# Patient Record
Sex: Female | Born: 1955 | Race: White | Hispanic: No | Smoking: Never smoker
Health system: Southern US, Community
[De-identification: ages and names within clinical notes are randomized; demographics above are authoritative.]

## PROBLEM LIST (undated history)

## (undated) DIAGNOSIS — Q899 Congenital malformation, unspecified: Secondary | ICD-10-CM

## (undated) DIAGNOSIS — I10 Essential (primary) hypertension: Secondary | ICD-10-CM

## (undated) DIAGNOSIS — E785 Hyperlipidemia, unspecified: Secondary | ICD-10-CM

## (undated) HISTORY — PX: TONSILLECTOMY: SUR1361

## (undated) HISTORY — DX: Congenital malformation, unspecified: Q89.9

## (undated) HISTORY — DX: Essential (primary) hypertension: I10

## (undated) HISTORY — DX: Hyperlipidemia, unspecified: E78.5

---

## 2017-04-22 ENCOUNTER — Encounter: Admit: 2017-04-22 | Discharge: 2017-04-22 | Payer: MEDICARE

## 2017-04-22 DIAGNOSIS — Z86711 Personal history of pulmonary embolism: ICD-10-CM

## 2017-04-22 DIAGNOSIS — Z86718 Personal history of other venous thrombosis and embolism: ICD-10-CM

## 2017-04-22 DIAGNOSIS — I219 Acute myocardial infarction, unspecified: ICD-10-CM

## 2017-04-22 DIAGNOSIS — E119 Type 2 diabetes mellitus without complications: ICD-10-CM

## 2017-04-22 DIAGNOSIS — E669 Obesity, unspecified: ICD-10-CM

## 2017-04-22 DIAGNOSIS — I2699 Other pulmonary embolism without acute cor pulmonale: ICD-10-CM

## 2017-04-22 DIAGNOSIS — R011 Cardiac murmur, unspecified: Principal | ICD-10-CM

## 2017-04-22 DIAGNOSIS — K922 Gastrointestinal hemorrhage, unspecified: ICD-10-CM

## 2017-04-22 DIAGNOSIS — G894 Chronic pain syndrome: Secondary | ICD-10-CM

## 2017-04-22 DIAGNOSIS — Z780 Asymptomatic menopausal state: ICD-10-CM

## 2017-04-22 DIAGNOSIS — R200 Anesthesia of skin: Secondary | ICD-10-CM

## 2017-04-22 DIAGNOSIS — F17200 Nicotine dependence, unspecified, uncomplicated: ICD-10-CM

## 2017-04-22 DIAGNOSIS — M549 Dorsalgia, unspecified: ICD-10-CM

## 2017-04-22 DIAGNOSIS — R0600 Dyspnea, unspecified: ICD-10-CM

## 2017-04-22 DIAGNOSIS — G988 Other disorders of nervous system: ICD-10-CM

## 2017-04-22 DIAGNOSIS — I2510 Atherosclerotic heart disease of native coronary artery without angina pectoris: ICD-10-CM

## 2017-04-22 DIAGNOSIS — R609 Edema, unspecified: ICD-10-CM

## 2017-04-22 DIAGNOSIS — E785 Hyperlipidemia, unspecified: Secondary | ICD-10-CM

## 2017-04-22 DIAGNOSIS — E349 Endocrine disorder, unspecified: ICD-10-CM

## 2017-04-22 DIAGNOSIS — R0789 Other chest pain: ICD-10-CM

## 2017-04-22 LAB — CBC AND DIFF
Lab: 0 10*3/uL (ref 0–0.20)
Lab: 0.1 10*3/uL (ref 0–0.45)
Lab: 0.3 10*3/uL (ref 0–0.80)
Lab: 3.1 10*3/uL (ref 1.0–4.8)
Lab: 3.8 10*3/uL (ref 1.8–7.0)
Lab: 4.3 M/UL (ref 4.0–5.0)
Lab: 7.4 10*3/uL (ref 4.5–11.0)

## 2017-04-22 LAB — URINALYSIS DIPSTICK
Lab: 6 (ref 5.0–8.0)
Lab: NEGATIVE
Lab: NEGATIVE
Lab: NEGATIVE
Lab: NEGATIVE
Lab: NEGATIVE
Lab: NEGATIVE
Lab: NEGATIVE
Lab: NEGATIVE

## 2017-04-22 LAB — OPIATES-URINE RANDOM: Lab: NEGATIVE % (ref 0–5)

## 2017-04-22 LAB — LIVER FUNCTION PANEL
Lab: 0.2 mg/dL — ABNORMAL LOW (ref 0.3–1.2)
Lab: 13 U/L (ref 7–56)
Lab: 17 U/L (ref 7–40)
Lab: 4.6 g/dL (ref 3.5–5.0)
Lab: 66 U/L (ref 25–110)
Lab: 7.5 g/dL (ref 6.0–8.0)

## 2017-04-22 LAB — BASIC METABOLIC PANEL
Lab: 103 MMOL/L (ref 98–110)
Lab: 138 MMOL/L (ref 137–147)
Lab: 26 MMOL/L (ref 21–30)
Lab: 4.1 MMOL/L (ref 3.5–5.1)
Lab: 84 mg/dL (ref 70–100)
Lab: 9 g/dL (ref 3–12)

## 2017-04-22 LAB — PHENCYCLIDINES-URINE RANDOM: Lab: NEGATIVE % (ref 0–2)

## 2017-04-22 LAB — URINALYSIS, MICROSCOPIC

## 2017-04-22 LAB — BENZODIAZEPINES-URINE RANDOM: Lab: NEGATIVE mg/dL (ref 8.5–10.6)

## 2017-04-22 LAB — POC GLUCOSE: Lab: 84 mg/dL (ref 70–100)

## 2017-04-22 LAB — AMPHETAMINES-URINE RANDOM: Lab: NEGATIVE mg/dL (ref 7–25)

## 2017-04-22 LAB — CANNABINOIDS-URINE RANDOM: Lab: NEGATIVE mL/min (ref 24–44)

## 2017-04-22 LAB — COCAINE-URINE RANDOM: Lab: NEGATIVE mL/min (ref 4–12)

## 2017-04-22 LAB — POC TROPONIN
Lab: 0 ng/mL (ref 0.00–0.05)
Lab: 0 ng/mL (ref 0.00–0.05)

## 2017-04-22 LAB — BARBITURATES-URINE RANDOM: Lab: NEGATIVE mg/dL (ref 0.4–1.00)

## 2017-04-22 LAB — POC PT/INR: Lab: 1 (ref 0.8–1.2)

## 2017-04-22 LAB — SED RATE: Lab: 20 mm/h (ref 0–30)

## 2017-04-22 LAB — POC CREATININE, RAD: Lab: 0.8 mg/dL (ref 0.4–1.00)

## 2017-04-22 MED ORDER — MORPHINE 15 MG PO TAB
7.5 mg | ORAL | 0 refills | Status: DC | PRN
Start: 2017-04-22 — End: 2017-04-23
  Administered 2017-04-23: 02:00:00 7.5 mg via ORAL

## 2017-04-22 MED ORDER — HYDROCODONE-ACETAMINOPHEN 5-325 MG PO TAB
1-2 | ORAL | 0 refills | Status: DC | PRN
Start: 2017-04-22 — End: 2017-04-26
  Administered 2017-04-23 (×5): 1 via ORAL
  Administered 2017-04-24: 21:00:00 2 via ORAL
  Administered 2017-04-24: 14:00:00 1 via ORAL
  Administered 2017-04-25 (×4): 2 via ORAL

## 2017-04-22 MED ORDER — ENOXAPARIN 40 MG/0.4 ML SC SYRG
40 mg | Freq: Every day | SUBCUTANEOUS | 0 refills | Status: DC
Start: 2017-04-22 — End: 2017-04-23

## 2017-04-22 MED ORDER — PANTOPRAZOLE 40 MG PO TBEC
40 mg | Freq: Two times a day (BID) | ORAL | 0 refills | Status: DC
Start: 2017-04-22 — End: 2017-04-26
  Administered 2017-04-23 – 2017-04-25 (×5): 40 mg via ORAL

## 2017-04-22 MED ORDER — NICOTINE (POLACRILEX) 4 MG BU GUM
4 mg | BUCCAL | 0 refills | Status: DC | PRN
Start: 2017-04-22 — End: 2017-04-26
  Administered 2017-04-23: 04:00:00 2 mg via BUCCAL

## 2017-04-22 MED ORDER — ASPIRIN 81 MG PO CHEW
324 mg | Freq: Once | ORAL | 0 refills | Status: CP
Start: 2017-04-22 — End: ?
  Administered 2017-04-22: 22:00:00 324 mg via ORAL

## 2017-04-22 MED ORDER — ONDANSETRON HCL (PF) 4 MG/2 ML IJ SOLN
4 mg | INTRAVENOUS | 0 refills | Status: DC | PRN
Start: 2017-04-22 — End: 2017-04-26

## 2017-04-22 MED ORDER — MORPHINE 30 MG PO CM24
30 mg | Freq: Two times a day (BID) | ORAL | 0 refills | Status: DC
Start: 2017-04-22 — End: 2017-04-23

## 2017-04-22 MED ORDER — NICOTINE 14 MG/24 HR TD PT24
1 | Freq: Every day | TRANSDERMAL | 0 refills | Status: DC
Start: 2017-04-22 — End: 2017-04-26
  Administered 2017-04-23 – 2017-04-25 (×4): 1 via TRANSDERMAL

## 2017-04-22 MED ORDER — ASPIRIN 81 MG PO CHEW
81 mg | Freq: Every day | ORAL | 0 refills | Status: DC
Start: 2017-04-22 — End: 2017-04-22

## 2017-04-22 MED ORDER — MORPHINE 2 MG/ML IV CRTG
2 mg | Freq: Once | INTRAVENOUS | 0 refills | Status: CP
Start: 2017-04-22 — End: ?
  Administered 2017-04-22: 22:00:00 2 mg via INTRAVENOUS

## 2017-04-22 MED ORDER — PANTOPRAZOLE 40 MG IV SOLR
80 mg | Freq: Once | INTRAVENOUS | 0 refills | Status: CP
Start: 2017-04-22 — End: ?
  Administered 2017-04-23: 80 mg via INTRAVENOUS

## 2017-04-22 MED ORDER — ACETAMINOPHEN 325 MG PO TAB
650 mg | ORAL | 0 refills | Status: DC | PRN
Start: 2017-04-22 — End: 2017-04-26
  Administered 2017-04-23 – 2017-04-25 (×4): 650 mg via ORAL

## 2017-04-22 MED ORDER — MORPHINE 2 MG/ML IV CRTG
2 mg | Freq: Once | INTRAVENOUS | 0 refills | Status: CP
Start: 2017-04-22 — End: ?
  Administered 2017-04-22: 23:00:00 2 mg via INTRAVENOUS

## 2017-04-22 MED ORDER — ALBUTEROL SULFATE 90 MCG/ACTUATION IN HFAA
2 | Freq: Every day | RESPIRATORY_TRACT | 0 refills | Status: DC
Start: 2017-04-22 — End: 2017-04-23

## 2017-04-22 MED ORDER — ALBUTEROL SULFATE 90 MCG/ACTUATION IN HFAA
1-2 | RESPIRATORY_TRACT | 0 refills | Status: DC | PRN
Start: 2017-04-22 — End: 2017-04-23
  Administered 2017-04-23: 04:00:00 2 via RESPIRATORY_TRACT

## 2017-04-22 NOTE — ED Notes
Stroke team activated

## 2017-04-22 NOTE — ED Notes
Report given to [place], Charity fundraiser. Pt care transferred at this time.

## 2017-04-22 NOTE — Acute Stroke Response
RN Stroke Activation Summary       Date of Service:  04/22/2017  Ann Garcia is a 61 y.o.  female.     DOB: 1955-08-20                  MRN#:  [id number]  Allergies:  Amitriptyline; Amitriptyline; Amoxicillin; Ativan [lorazepam]; Divalproex; Erythromycin; Erythromycin; Penicillins; Penicillins; Relafen [nabumetone]; Sulfa (sulfonamide antibiotics); Sulfa (sulfonamide antibiotics); Sulfamethoxazole-trimethoprim; Sulfamethoxazole-trimethoprim; Tetracycline; Tetracycline; Toradol [ketorolac]; and Wellbutrin [bupropion hcl]       ASRT Arrival: 1538  Location of Response : [facility] trauma bay    Page Received: 1534          Clinical Presentation: Sensory changes, Other (comment)(R jaw pain, chest pain)    Total Stroke Scale Score: 2    Signs & Symptoms: Onset of Symptoms  Onset of Symptoms - Date: 04/22/17  Onset of Symptoms - Time: 1330     Dysphagia screen:  Did Patient Pass The Swallow Screen Part I?: Yes     Did Patient Pass The Swallow Screen Part II: Yes     Did Patient Pass The Swallow Screen: Yes         Assessment & Plan     Summary:  Pt reports she initially experienced intense chest pain with associated R jaw pain and ride face numbness at 1330 while she was exerting herself. She rested and initially felt better but then symptoms returned at about 1430 so she came to the ED. She stats her symptoms are nearly identical to previous MI for which she received no intervention. Of note, pt also reports current bloody stools that she says are from ulcers. During exam patient states her right side felt about 50% as strong as her left to touch but was able to distinguish sharp vs. Dull. She said on her lower extremity the left was actually sharper. She had difficulty raising her right leg but stated it was due to her severe back pain. CT, CTA obtained. Pt also reporting a burning sensation along her scalp with associated hair loss and vision blurriness on both sides.   Radiology/Interventional Delay  Decision to IR: No Delays: No        No current facility-administered medications for this encounter.   N/A      Plan: no acute stroke intervention. Continue ED evaluation for R numbness and chest pain. Will obtain ESR after reports of scalp tenderness.          RN handoff: Clarisse Gouge     History of Present Illness     Past Medical History:   Diagnosis Date   ??? Back pain    ??? Chest pain     jaw pain, right side of neck relieved nitro or laying down   ??? Depression (disease)    ??? DM (diabetes mellitus) (HCC)    ??? Dyslipidemia 02/20/2010   ??? Dyspnea    ??? Edema    ??? History of DVT (deep vein thrombosis) 02/20/2010   ??? History of pulmonary embolism 02/02/2010   ??? Hyperlipemia    ??? MI (myocardial infarction) (HCC)    ??? Murmur    ??? Obesity    ??? PE (pulmonary embolism)    ??? Postmenopausal    ??? Tobacco use disorder 02/20/2010   ??? Unspecified cardiovascular disease    ??? Unspecified disorders of nervous system    ??? Unspecified endocrine disorder      Past Surgical History:   Procedure Laterality Date   ??? CARDIOVASCULAR  STRESS TEST  2010    Pt states it was Incomplete due to collapsing   ??? CARDIAC CATHERIZATION  2011    clean   ??? DOPPLER ECHOCARDIOGRAPHY     ??? ELECTROCARDIOGRAM     ??? HX BACK SURGERY       Social History     Socioeconomic History   ??? Marital status: Married     Spouse name: Not on file   ??? Number of children: Not on file   ??? Years of education: Not on file   ??? Highest education level: Not on file   Social Needs   ??? Financial resource strain: Not on file   ??? Food insecurity - worry: Not on file   ??? Food insecurity - inability: Not on file   ??? Transportation needs - medical: Not on file   ??? Transportation needs - non-medical: Not on file   Occupational History   ??? Not on file   Tobacco Use   ??? Smoking status: Current Every Day Smoker     Packs/day: 0.75     Years: 15.00     Pack years: 11.25     Types: Cigars   ??? Tobacco comment: asking for help, tried patches however, burned skin,  has rx for Chantix   Substance and Sexual Activity ??? Alcohol use: Yes     Alcohol/week: 2.4 oz     Types: 4 Cans of beer per week     Comment: no really, casually   ??? Drug use: No   ??? Sexual activity: Not on file   Other Topics Concern   ??? Not on file   Social History Narrative   ??? Not on file         Mathews Argyle, RN

## 2017-04-22 NOTE — Acute Stroke Response
NAME:Ann Garcia             MRN: [id number]             DOB:July 22, 1955          AGE: 61 y.o.  ADMISSION DATE: 04/22/2017             DAYS ADMITTED: LOS: 0 days    Date of Service:  04/22/2017    Allergies:  Amitriptyline; Amitriptyline; Amoxicillin; Ativan [lorazepam]; Divalproex; Erythromycin; Erythromycin; Penicillins; Penicillins; Relafen [nabumetone]; Sulfa (sulfonamide antibiotics); Sulfa (sulfonamide antibiotics); Sulfamethoxazole-trimethoprim; Sulfamethoxazole-trimethoprim; Tetracycline; Tetracycline; Toradol [ketorolac]; and Wellbutrin [bupropion hcl]    Type of Acute Stroke Response Team note: Consult       Assessment & Plan     Chief Complaint: Chest pain/numbness    Assessment: Ann Garcia is a 61 y.o. female with h/o DVT/PE, back pain and with CAD (MI in 8/18) HLD, tobacco use disorder presented with chest/R jaw pain at 1330. She reports R side numbness starting around 1445. 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 NIHSS was 2 for R face, RUE and LLE numbness and RLE drift that is likely secondary to her chronic back pain. No IV tPA as risk outweighs benefit in this case. BP: 114/71, INR: 1.0, Trop: 0.0    Impression: Distribution of numbness is atypical of stroke although this does not rule out a small stroke    Suspected localization of Stroke Sx: Left Thalamus    Suspected etiology: Small - Vessel Occlusion    Pre-event mRS: 0 - No symptoms at all     Plan:   - No acute stroke intervention  - ER should continue work up for recent GI bleed and acute angina  - Primary team can obtain MRI of the head w/o contrast in setting of persistent numbness  - The stroke service will follow along    The patient was seen and discussed with Dr. Debroah Loop    History of Present Ilness     History of Present Illness:   Ann Garcia was at home when she began experiencing chest and R jaw pain at 1330. She reports R side numbness starting around 1445. She also reports bifrontal HA that started this morning. She denies weakness, gait instability, vertigo, diplopia or word finding difficulties. She reports receiving treatment for MI in August of 2018. The chest pain is reportedly constant and is not alleviated by repositioning. She also denies any previous stroke like symptoms.     Review of Systems     A 14 point review of systems was negative except for: Cardiovascular: positive for chest pain  Gastrointestinal: positive for diarrhea  Musculoskeletal: positive for back pain  Neurological: positive for headaches and paresthesia    Constitutional: negative   Eyes: negative   Ears, nose, mouth, throat, and face: negative   Respiratory: negative    Genitourinary: negative   Integument/breast: negative   Hematologic/lymphatic: negative   Behavioral/Psych: negative   Endocrine: negative   Allergic/Immunologic: negative    Stroke Activation Summary                               CT/CTP/CTA:        BP: 114/71 (12/17 1600)  Temp: 36 ???C (96.8 ???F) (12/17 1555)  Pulse: 75 (12/17 1600)  Respirations: 16 PER MINUTE (12/17 1600)  SpO2: 97 % (12/17 1630)  O2  Delivery: None (Room Air) (12/17 1630)  SpO2 Pulse: 79 (12/17 1630)  Height: 160 cm (63) (12/17 1527)    NIHSS Completed at: 1545      NIH Stroke Scale Item  Scoring Definition  Score    1a. LOC  0=alert and responsive   1=arousable to minor stimulation   2=arousable only to painful stimulation   3=reflex responses or unrousable  0    1b. LOC questions-as patient???s age and month. Must be exact.  0=both correct   1=one correct (or dysarthria, intubated, foreign language)   2=neither correct  0    1c. Commands-open/close eyes, grip and release non-paretic hand (other 1 step commands or mimic OK)  0=both correct (ok if impaired by weakness)   1=one correct   2=neither correct  0    2. Best Gaze-horizontal EOM by voluntary or Ann Garcia???s  0=normal   1=partial gaze palsy (abnormal gaze in one or both eyes) 2=forced eye deviation or total paresis which cannot be overcome by Ann Garcia???s  0    3. Visual Field-use visual threat if necessary. If monocular, score field of good eye  0=no visual loss   1=partial hemianopia, quadrantanopia, extinction   2=complete hemianopia   3=bilateral hemianopia or blindness  0    4. Facial Palsy-if stuporous, check symmetry of grimace to pain  0=normal   1=minor paralysis, flat NLF, asymm smile   2=partial paralysis (lower face=UMN)   3=complete paralysis (upper and lower face)  0    5. Motor Arm-arms outstretched 90 deg (sitting) or 45 deg (supine) for 10 seconds. Encourage best effort.  0=no drift x 10 seconds   1=drift but doesn???t hit bed   2=some antigravity effort, but can???t sustain   3=no antigravity effort, but even minimal mvt counts   4=no movement at all   X=unable to assess due to amputation, fusion, etc  L/R   0/0    6. Motor Leg-raise leg to 30 degrees supine x 5 seconds  0=no drift x 5 seconds   1=drift but doesn???t hit bed   2=some antigravity effort, but can???t sustain   3=no antigravity effort, but even minimal mvt counts   4=no movement at all   X=unable to assess due to amputation, fusion, etc  L/R   0/1 (likely due to back pain)    7. Limb Ataxia-check finger-nose- finger; heel-shin; and score only if out of proportion to paralysis  0=no ataxia (or aphasic, hemiplegic)   1=ataxia in upper or lower extremity   2=ataxia in upper AND lower extremity   X=unable to assess due to amputation, fusion, etc  0   8. Sensory-use safety pin. Check grimace or withdrawal if stuporous. Score only stroke- related losses  0=normal   1=mild-mod unilateral loss but patient aware of touch 9or aphasic, confused)   2=total loss, pt unaware of touch. Coma, bilateral loss  1   9. Best Language-describe cookie jar picture, name objects, read sentences. May use repeating, writing, stereognosis  0=normal   1=mild-mod aphasia (diff but partly comprehensible) 2=severe aphasia (almost no info exchanged)   3=mute, global aphasia, coma. No 1 step commands  0    10. Dysarthria-read list of words  0=normal   1=mild-mod; slurred but intelligible   2=severe; unintelligible or mute  0    11. Extinction/Neglect- simultaneously touch patient on both hands, show fingers in both visual fields, ask about deficit, left hand  0=normal, none detected. (visual loss alone)   1=neglects or extinguishes to double simult  stimulation in any modality   2=profound neglect in more than one modality  0    Score    2       Was IV tPA given? No    The patient was not a tPA candidate due to risk > benefit      Dysphagia screen:                      Performed by nursing staff and passed     Cardiac rhythm on presentation: SR         Health History     Past Medical History:   Diagnosis Date   ??? Back pain    ??? Chest pain     jaw pain, right side of neck relieved nitro or laying down   ??? Depression (disease)    ??? DM (diabetes mellitus) (HCC)    ??? Dyslipidemia 02/20/2010   ??? Dyspnea    ??? Edema    ??? History of DVT (deep vein thrombosis) 02/20/2010   ??? History of pulmonary embolism 02/02/2010   ??? Hyperlipemia    ??? MI (myocardial infarction) (HCC)    ??? Murmur    ??? Obesity    ??? PE (pulmonary embolism)    ??? Postmenopausal    ??? Tobacco use disorder 02/20/2010   ??? Unspecified cardiovascular disease    ??? Unspecified disorders of nervous system    ??? Unspecified endocrine disorder      Past Surgical History:   Procedure Laterality Date   ??? CARDIOVASCULAR STRESS TEST  2010    Pt states it was Incomplete due to collapsing   ??? CARDIAC CATHERIZATION  2011    clean   ??? DOPPLER ECHOCARDIOGRAPHY     ??? ELECTROCARDIOGRAM     ??? HX BACK SURGERY       Family History   Problem Relation Age of Onset   ??? Hypertension Mother    ??? Coronary Artery Disease Father    ??? Sudden Cardiac Death Father    ??? Hypertension Father    ??? Coronary Artery Disease Sister    ??? Premature Heart Disease Sister    ??? Hypertension Sister Social History     Socioeconomic History   ??? Marital status: Married     Spouse name: Not on file   ??? Number of children: Not on file   ??? Years of education: Not on file   ??? Highest education level: Not on file   Social Needs   ??? Financial resource strain: Not on file   ??? Food insecurity - worry: Not on file   ??? Food insecurity - inability: Not on file   ??? Transportation needs - medical: Not on file   ??? Transportation needs - non-medical: Not on file   Occupational History   ??? Not on file   Tobacco Use   ??? Smoking status: Current Every Day Smoker     Packs/day: 0.75     Years: 15.00     Pack years: 11.25     Types: Cigars   ??? Tobacco comment: asking for help, tried patches however, burned skin,  has rx for Chantix   Substance and Sexual Activity   ??? Alcohol use: Yes     Alcohol/week: 2.4 oz     Types: 4 Cans of beer per week     Comment: no really, casually   ??? Drug use: No   ??? Sexual activity: Not on file   Other  Topics Concern   ??? Not on file   Social History Narrative   ??? Not on file        Medications:      PRN Medications:         Physical Exam     HEENT: normocephalic, eyes open with no discharge, nares patent, oropharynx is clear with no lesions, palate intact  CV: regular rate, distal pulses palpable  Chest: normal configuration, equal chest rise bilaterally  Ab: soft, non-tender, no masses, no organomegaly  Skin: no rashes or lesions    Extended Neuro Exam:    Mental status: alert, oriented to person/place/time  Speech:    Normal Abnormal   Fluency x    Comprehension x    Articulation x    Repetition x    Naming x        Cranial Nerves:    Normal Abnormal   II x    III, IV, VI x    V x    VII                         x                             VIII x    IX, X x    XI x    XII x        Muscle/motor:   Tone: nml  Bulk: nml  Fasciculations: none  Pronator drift: none     NF NE SA EF EE WE WF FF FE FA TA HF HA HE KF KE DF PF   R   5 5 5   5 5   4     5 5    L   5 5 5   5 5   5     5 5        Sensation: Normal RUE LUE RLE LLE   Light Touch  x  x    Pin Prick  x  x    Temperature        Vibration        Proprioception            Coordination:    Normal Abnormal Right Abnormal Left   Finger to Nose x     Rapid alternating  x     Heel to Shin x     Finger tap      Foot tap        Gait and Sation:  deferred    Reflexes:    Right Left   Triceps     Biceps 2 2   Brachioradialis 2 2   Patella 2 2   Ankle 2 2   Plantar down down          Lab/Radiology/Other Diagnostic Tests:  Hematology:    Lab Results   Component Value Date    HGB 13.3 04/22/2017    HCT 39.3 04/22/2017    PLTCT 279 04/22/2017    WBC 7.4 04/22/2017    NEUT 51 04/22/2017    ANC 3.80 04/22/2017    ALC 3.10 04/22/2017    MONA 4 04/22/2017    AMC 0.30 04/22/2017    ABC 0.00 04/22/2017    MCV 91.5 04/22/2017    MCHC 33.7 04/22/2017    MPV 7.7 04/22/2017    RDW 14.8 04/22/2017   , Coagulation:  Lab Results   Component Value Date    PTT 37.1 06/06/2011    INR 1.0 04/22/2017    INR 1.2 06/06/2011    and General Chemistry:    Lab Results   Component Value Date    NA 138 04/22/2017    K 4.1 04/22/2017    CL 103 04/22/2017    GAP 9 04/22/2017    BUN 22 04/22/2017    CR 0.8 04/22/2017    CR 0.70 04/22/2017    GLU 84 04/22/2017    GLU 87 09/01/2005    CA 9.7 04/22/2017    ALBUMIN 4.6 04/22/2017    LACTIC 0.9 04/25/2007    MG 1.8 06/06/2011    TOTBILI 0.2 04/22/2017     Glucose: 84 (04/22/17 1535)  Hemoccult: (S) Positive (04/22/17 1613)  Pertinent radiology reviewed.    CT head:  1. No evidence of acute intracranial hemorrhage or mass effect.  2. 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  3. Mild supratentorial white matter hypodensities, likely secondary to chronic microvascular ischemic disease.    ___________________________________________  Wess Botts, APRN-NP  Vascular Neurology  [phone]

## 2017-04-22 NOTE — ED Notes
Pt reports headache and ear buzzing. Will notify Dr Pricilla Riffle.

## 2017-04-22 NOTE — ED Notes
All belongings gathered and placed in belonging bag with patient labels at bedside.  The bag(s) contain(s) the following:    Clothing: shirt, bra, pants, shoes  Other: purse; back brace    All belongings placed in 1 bag(s).    Belongings disposition: all with patient at bedside.

## 2017-04-22 NOTE — Progress Notes
Stroke Activation:     10 F with a h/o CAD, DM, HTN, ongoing GI bleeding, presents with chest pain, right jaw pain and right sided numbness. LKW - 2:30 PM on 04/22/17. Right sided face, arm and leg numbness started after right jaw pain. She has chronic back pain. She reports some headache started after chest pain.      BP 114/71. NIHSS 2 (right sided numbness, old R leg drift). CT head no acute changes.     No tPA due to current GI bleed and low NIHSS. No suspicion for LVO. Stroke mimic vs small stroke. Further work up for chest pain and GI bleeding per ED. Get an MRI. Will follow up on MRI.           Otelia Limes MD, MPH  Vascular Neurology Fellow   Pager: [phone]  Stroke Pager: [phone]

## 2017-04-22 NOTE — ED Notes
Pt wheeled to bathroom with ED tech. ABCs intact and NAD noted. Pt provided urine cup and wipes. Pt educated about specimen collection, and verbalized understanding.

## 2017-04-22 NOTE — ED Notes
61 yr old F presents to ED via EMS with CC of R jaw pain ~1330. Reports initial concern for heart attack stating "it feels like the last one I had". Reports numbness to R side ~1445. Hx of low back pain. Reports unable to lift RLE due to back pain. Pt is aaox4, skin p/w/d, and eupneic on RA. Bed in low, locked position with side rails up x 2. Pt placed on monitor. Stroke and treatment team at bedside.

## 2017-04-22 NOTE — ED Notes
Pt also reporting hx of bleeding ulcers with dark blood in stools last night. Denies use of blood thinners.

## 2017-04-23 DIAGNOSIS — R079 Chest pain, unspecified: Principal | ICD-10-CM

## 2017-04-23 LAB — RVP VIRAL PANEL PCR: Lab: DETECTED

## 2017-04-23 LAB — CBC AND DIFF: Lab: 6.2 K/UL — ABNORMAL HIGH (ref >60)

## 2017-04-23 LAB — BASIC METABOLIC PANEL
Lab: 107 MMOL/L (ref >60)
Lab: 138 MMOL/L (ref 137–147)
Lab: 3.8 MMOL/L — ABNORMAL LOW (ref 3.5–5.1)

## 2017-04-23 LAB — MAGNESIUM: Lab: 2.1 mg/dL — ABNORMAL HIGH (ref >60)

## 2017-04-23 MED ORDER — PEG-ELECTROLYTE SOLN 420 GRAM PO SOLR
2 L | ORAL | 0 refills | Status: DC | PRN
Start: 2017-04-23 — End: 2017-04-26

## 2017-04-23 MED ORDER — PEG-ELECTROLYTE SOLN 420 GRAM PO SOLR
4 L | ORAL | 0 refills | Status: DC
Start: 2017-04-23 — End: 2017-04-26

## 2017-04-23 MED ORDER — OSELTAMIVIR 75 MG PO CAP
75 mg | Freq: Two times a day (BID) | ORAL | 0 refills | Status: DC
Start: 2017-04-23 — End: 2017-04-26
  Administered 2017-04-24 – 2017-04-25 (×4): 75 mg via ORAL

## 2017-04-23 MED ORDER — BISACODYL 5 MG PO TBEC
10 mg | Freq: Once | ORAL | 0 refills | Status: AC | PRN
Start: 2017-04-23 — End: ?

## 2017-04-23 MED ORDER — AMINOPHYLLINE 500 MG/20 ML IV SOLN
50 mg | INTRAVENOUS | 0 refills | Status: AC | PRN
Start: 2017-04-23 — End: ?

## 2017-04-23 MED ORDER — FLU VACC QS2018-19 6MOS UP(PF) 60 MCG (15 MCG X 4)/0.5 ML IM SYRG
.5 mL | Freq: Once | INTRAMUSCULAR | 0 refills | Status: CP
Start: 2017-04-23 — End: ?
  Administered 2017-04-25: 16:00:00 0.5 mL via INTRAMUSCULAR

## 2017-04-23 MED ORDER — ALBUTEROL SULFATE 90 MCG/ACTUATION IN HFAA
2 | Freq: Every day | RESPIRATORY_TRACT | 0 refills | Status: DC
Start: 2017-04-23 — End: 2017-04-26
  Administered 2017-04-24: 12:00:00 2 via RESPIRATORY_TRACT

## 2017-04-23 MED ORDER — FLU VACC QS2018-19 6MOS UP(PF) 60 MCG (15 MCG X 4)/0.5 ML IM SYRG
.5 mL | Freq: Once | INTRAMUSCULAR | 0 refills | Status: DC
Start: 2017-04-23 — End: 2017-04-23

## 2017-04-23 MED ORDER — NITROGLYCERIN 0.4 MG SL SUBL
.4 mg | SUBLINGUAL | 0 refills | Status: AC | PRN
Start: 2017-04-23 — End: ?

## 2017-04-23 MED ORDER — SODIUM CHLORIDE 0.9 % IV SOLP
INTRAVENOUS | 0 refills | Status: CN
Start: 2017-04-23 — End: ?

## 2017-04-23 MED ORDER — ALBUTEROL SULFATE 90 MCG/ACTUATION IN HFAA
2 | RESPIRATORY_TRACT | 0 refills | Status: DC | PRN
Start: 2017-04-23 — End: 2017-04-26

## 2017-04-23 MED ORDER — SODIUM CHLORIDE 0.9 % IV SOLP
250 mL | INTRAVENOUS | 0 refills | Status: AC | PRN
Start: 2017-04-23 — End: ?

## 2017-04-23 MED ORDER — GADOBENATE DIMEGLUMINE 529 MG/ML (0.1MMOL/0.2ML) IV SOLN
15 mL | Freq: Once | INTRAVENOUS | 0 refills | Status: CP
Start: 2017-04-23 — End: ?
  Administered 2017-04-23: 08:00:00 15 mL via INTRAVENOUS

## 2017-04-23 MED ORDER — REGADENOSON 0.4 MG/5 ML IV SYRG
.4 mg | Freq: Once | INTRAVENOUS | 0 refills | Status: CP
Start: 2017-04-23 — End: ?
  Administered 2017-04-23: 14:00:00 0.4 mg via INTRAVENOUS

## 2017-04-23 NOTE — Case Management (ED)
Case Management Admission Assessment    NAME:Ann Garcia                          MRN: [id number]             DOB:22-Aug-1955          AGE: 61 y.o.  ADMISSION DATE: 04/22/2017             DAYS ADMITTED: LOS: 1 day      Today???s Date: 04/23/2017    Source of Information: patient     Plan  Plan: Case Management Assessment, Discharge Planning for Facility Anticipated(back to shelter)     Pt came from [facility] in [place] [place]  Pt will go back at d/c  No family support here  NCM reviewed EMR, labs, MAR and Notes.  No ongoing therapy noted.  No DME requested.  No other CM needs identified at this time  NCm will continue to assess and monitor for changes          Patient Address/Phone  [address]  [place] [address]  [phone] (home)     Emergency Contact  Extended Emergency Contact Information  Primary Emergency Contact: Edd [place]  Home Phone: [phone]  Mobile Phone: [phone]  Relation: Daughter  Interpreter needed? No  Secondary Emergency Contact: Laury Deep  Mobile Phone: [phone]  Relation: Son  Preferred language: ENGLISH  Interpreter needed? No    Forensic scientist: No, patient does not have a healthcare directive  Would patient like to fill out a (a new) Editor, commissioning?: N/A  Psych Advance Directive (Psych unit only): No, patient does not have a Social research officer, government  Does the patient need discharge transport arranged?: Yes  Transportation Name, Phone and Availability #1: cab voucher    Expected Discharge Date       Living Situation Prior to Admission  ? Living Arrangements  Type of Residence: Homeless  Shelter Name: [facility] [place]  Living Arrangements: Alone  ? Level of Function   Prior level of function: Independent  ? Cognitive Abilities   Cognitive Abilities: Alert and Oriented, Engages in problem solving and planning    Financial Resources  ? Coverage  Primary Insurance: Medicare Secondary Insurance: No insurance  Additional Coverage: RX    ? Source of Income   Source Of Income: SSDI  ? Financial Assistance Needed?  no    Psychosocial Needs  ? Mental Health     ? Substance Use History     ? Other  no    Current/Previous Services  ? PCP  Fawn Kirk, [phone], [phone]  ? Pharmacy    [facility] [id number] - [address] [place], [place] - [address]  [address] [facility]  [place] [address]  Phone: [phone] Fax: [phone]    ? Durable Medical Equipment   Durable Medical Equipment at home: None  ? Home Health  Receiving home health: No  ? Hemodialysis or Peritoneal Dialysis  Undergoing hemodialysis or peritoneal dialysis: No  ? Tube/Enteral Feeds  Receive tube/enteral feeds: No  ? Infusion  Receive infusions: No  ? Private Duty  Private duty help used: No  ? Home and Community Based Services  Home and community based services: No  ? Ryan White  Ryan White: No  ? Hospice  Hospice: No  ? Outpatient  Therapy  PT: No  OT: No  SLP: No  ? Skilled Nursing Facility/Nursing Home  SNF: No  NH: No  ? Inpatient Rehab  IPR: No  ? Long-Term Acute Care Hospital  LTACH: No  ? Acute Hospital Stay  Acute Hospital Stay: In the past  Was patient's stay within the last 30 days?: No  When did patient receive care?: 12-2016  Name of hospital: [facility] [place], Jaci Lazier RN-NCM  Case Management Department  [phone]  [phone]

## 2017-04-23 NOTE — Progress Notes
RT Adult Assessment Note    NAME:Jacyln JERMIAH BASICH             MRN: [id number]             DOB:1955/08/30          AGE: 61 y.o.  ADMISSION DATE: 04/22/2017             DAYS ADMITTED: LOS: 0 days    RT Treatment Plan:  Protocol Plan: Medications  Albuterol: MDI Q day(Home reg)    Protocol Plan: Procedures  Comment: No OSA Hx    Additional Comments:  Impressions of the patient: No respiratory distress.  Intervention(s)/outcome(s): Cont on home reg Alb QDay.  Patient education that was completed: none  Recommendations to the care team: none    Vital Signs:  Pulse:    RR: Respirations: 16 PER MINUTE  SpO2: SpO2: 95 %  O2 Device:  room air  Liter Flow:    O2%: O2 Percent: 21 %  Breath Sounds:    Respiratory Effort: Respiratory Effort: Non-Labored

## 2017-04-23 NOTE — ED Notes
[id number]. Cleaning. Please call Tim @ [phone] for report

## 2017-04-23 NOTE — Consults
Gastroenterology Consult Note  Patient Name:Ann Garcia         ZOX:[id number]  Admission Date: 04/22/2017  3:53 PM      Principal Problem:    Chest pain  Active Problems:    Tobacco use disorder    Chronic pain syndrome    Melena      History of Present Illness/Subjective:  Ann Garcia is a 61 y.o. female with history of chronic pain on opioid narcotics was admitted 12/17 for chest pain, right-sided numbness reported GI bleeding.    She was admitted with chest pain and right-sided jaw pain with right-sided numbness.  She was stroke activated and recommendation was no acute stroke intervention, low suspicion.   Per documentation, she was apparently perseverating on need for pain medications during her admission interview with pain in her head, neck, back legs and feet.  She reported being on long-acting morphine but there is been no fill records in [facility] for several years.    On speaking with her, the rectal bleeding has been ongoing since August 2018 and her last endoscopy was 2006 at an outside hospital.  She describes having both episodes of dark stool and small volume hematochezia.  She actually also endorses hemoptysis on some occasions, again small volume.  She seems to have both symptoms of constipation and diarrhea has not tried any specific therapy for these.  She also have symptoms of dyspepsia and dysphagia and does take Prilosec.  Last episode with hematochezia was Monday and she most recently saw dark, liquid stool on Wednesday.    Assessment/ Plan:    Concern for GI bleeding  ??? Patient reports melena and hematochezia.  Also intermittent hematemesis.  Essentially gives [place] a pan positive GI review of systems  ??? Last endoscopy reportedly 2006.    ??? Hemoglobin 12.1, no anticoagulation.    Chest pain, normal stress test 12/18   Positive for Flu A (H1 N1)  Tobacco abuse  Chronic pain reportedly on opioid narcotic therapy    Recommendations: -Proceed with EGD and colonoscopy tomorrow (hematemesis, melena and hematochezia)  -NPO at 0000, hold anticoagulation    Patient discussed with Dr. Marina Goodell, MD  GI/Hepatology Fellow [phone]    -----------------------------  PMH:  Past Medical History:   Diagnosis Date   ??? Back pain    ??? Chest pain     jaw pain, right side of neck relieved nitro or laying down   ??? Depression (disease)    ??? DM (diabetes mellitus) (HCC)    ??? Dyslipidemia 02/20/2010   ??? Dyspnea    ??? Edema    ??? History of DVT (deep vein thrombosis) 02/20/2010   ??? History of pulmonary embolism 02/02/2010   ??? Hyperlipemia    ??? MI (myocardial infarction) (HCC)    ??? Murmur    ??? Obesity    ??? PE (pulmonary embolism)    ??? Postmenopausal    ??? Tobacco use disorder 02/20/2010   ??? Unspecified cardiovascular disease    ??? Unspecified disorders of nervous system    ??? Unspecified endocrine disorder        Current medications:  No current facility-administered medications on file prior to encounter.      Current Outpatient Medications on File Prior to Encounter   Medication Sig Dispense Refill   ??? albuterol (PROVENTIL; VENTOLIN) 90 mcg/actuation inhaler Inhale 2 Puffs by mouth every 6 hours as needed.       ???  morphine ER (AVINZA) 30 mg PO capsule Take 30 mg by mouth every 12 hours      ??? nitroglycerin (NITROSTAT) 0.4 mg tablet Place 0.4 mg under tongue every 5 minutes as needed.         PSH:  Past Surgical History:   Procedure Laterality Date   ??? CARDIOVASCULAR STRESS TEST  2010    Pt states it was Incomplete due to collapsing   ??? CARDIAC CATHERIZATION  2011    clean   ??? DOPPLER ECHOCARDIOGRAPHY     ??? ELECTROCARDIOGRAM     ??? HX BACK SURGERY         SH:  Social History     Socioeconomic History   ??? Marital status: Married     Spouse name: Not on file   ??? Number of children: Not on file   ??? Years of education: Not on file   ??? Highest education level: Not on file   Social Needs   ??? Financial resource strain: Not on file ??? Food insecurity - worry: Not on file   ??? Food insecurity - inability: Not on file   ??? Transportation needs - medical: Not on file   ??? Transportation needs - non-medical: Not on file   Occupational History   ??? Not on file   Tobacco Use   ??? Smoking status: Current Every Day Smoker     Packs/day: 0.75     Years: 15.00     Pack years: 11.25     Types: Cigars   ??? Tobacco comment: asking for help, tried patches however, burned skin,  has rx for Chantix   Substance and Sexual Activity   ??? Alcohol use: Yes     Alcohol/week: 2.4 oz     Types: 4 Cans of beer per week     Comment: no really, casually   ??? Drug use: No   ??? Sexual activity: Not on file   Other Topics Concern   ??? Not on file   Social History Narrative   ??? Not on file       FH:  Family History   Problem Relation Age of Onset   ??? Hypertension Mother    ??? Coronary Artery Disease Father    ??? Sudden Cardiac Death Father    ??? Hypertension Father    ??? Coronary Artery Disease Sister    ??? Premature Heart Disease Sister    ??? Hypertension Sister        Review of Systems:  Constitutional: No fevers, chills, ++weight loss  Eyes: No vision deficit, no icterus  Ears, nose, mouth: No oral bleeding, ulcer  Cardiovascular: No chest pain, palpitations  Respiratory: ++dyspnea, cough   Gastrointestinal: ++abdominal pain, blood in stool  Musculoskeltal: No joint inflammation, new deformity  Integumentary: No rashes, exudate  Neurologic: No cognitive change, focal weakness  Hematologic: No for easy bleeding or bruising  Please see HPI for additional pertinent documentation    Physical Exam:  Vitals:    04/22/17 2200 04/22/17 2305 04/23/17 0230 04/23/17 0552   BP:  108/55 114/59    Pulse:  78 68    Temp:  36.6 ???C (97.9 ???F) 36.5 ???C (97.7 ???F)    SpO2: 95% 100% 99% 94%   Weight:       Height:         Constitutional- Vitals above, no acute distress.   Head - Normocephalic, atraumatic.   Eyes -  EOMI grossly. No icterus or injection. Ears, nose,  mouth, throat- No oral ulcer or bleeding.   Neck - No swelling or tracheal deviation.   Respiratory - Symmetric chest rise, no increased work of breathing.  Cardiovascular - Peripheral pulses intact, no pedal edema.  Gastrointestinal- Non-TTP. No hepatospenomegaly.   Skin - No exposed rash, lesion.  Neurologic - No CN deficit, normal fluid speech  Psychiatric - Tangential.     Labs/Imaging:  Pertient labs/imaging were reviewed on initiation of progress note.

## 2017-04-23 NOTE — Care Coordination-Inpatient
Please page [phone] with any questions until 8am after that please page Med private D

## 2017-04-23 NOTE — Progress Notes
Neurology Stroke Service: Interval Note    Ann Garcia     Principal Problem:    Chest pain  Active Problems:    Tobacco use disorder    Chronic pain syndrome    Melena    Updated Assessment:  Ann Garcia was stroke activated on 12/17 for right side numbness in the setting of R jaw and chest pain. The distribution of numbness on 12/17 was atypical for stroke but we were not able to rule it out. We requested a MRI of the head w/o contrast which was obtained overnight. These images found no evidence of acute stroke. Ann Garcia was assessed today by the Stroke Service and her right side numbness persists. A thorough sensory exam was completed which included a vibration. Again, the exam was atypical for stroke.     Impression:   Numbness is unlikely to be from stroke etiology. More favored is an acute response to stress/anxiety as she has verbalized significant stress as of late. She reports a HA at this time and complicated migraines can play a role to sensory deficits.    Plan:    Secondary Stroke Risk optimization:  - Start 81mg  ASA daily  - BP goals: <130/80  - LDL goal is <70  - Hgb A1c goal is <7.0  - Maintain smoking abstinence  - Follow up with PCP for longitudinal stroke risk factor management  - Thank you for allowing [place] to participate in Ann Garcia care, the stroke service will sign off at this time. Please page [phone] for questions or concerns.    Patient seen and discussed with Dr. Debroah Loop    ________________________________  Wess Botts, APRN-NP  Vascular Neurology  [phone]

## 2017-04-23 NOTE — ED Notes
Pt report given to Pat Kocher RN for [employer] [id number]

## 2017-04-23 NOTE — ED Notes
Report given to Tim, RN.

## 2017-04-24 ENCOUNTER — Encounter: Admit: 2017-04-24 | Discharge: 2017-04-24 | Payer: MEDICARE

## 2017-04-24 DIAGNOSIS — K922 Gastrointestinal hemorrhage, unspecified: Principal | ICD-10-CM

## 2017-04-24 LAB — CBC AND DIFF
Lab: 39 % — ABNORMAL HIGH (ref 36–45)
Lab: 4.4 M/UL — ABNORMAL LOW (ref >60)
Lab: 6.5 K/UL — ABNORMAL HIGH (ref >60)

## 2017-04-24 LAB — TROPONIN-I: Lab: 0 ng/mL (ref 0.0–0.05)

## 2017-04-24 LAB — MAGNESIUM: Lab: 2.1 mg/dL (ref >60)

## 2017-04-24 LAB — BASIC METABOLIC PANEL: Lab: 137 MMOL/L — ABNORMAL HIGH (ref >60)

## 2017-04-24 MED ORDER — SODIUM CHLORIDE 0.9 % IV SOLP
INTRAVENOUS | 0 refills | Status: DC
Start: 2017-04-24 — End: 2017-04-26
  Administered 2017-04-24 – 2017-04-25 (×3): 1000.000 mL via INTRAVENOUS

## 2017-04-24 MED ORDER — SODIUM CHLORIDE 0.9 % IV SOLP
INTRAVENOUS | 0 refills | Status: CN
Start: 2017-04-24 — End: ?

## 2017-04-24 NOTE — Progress Notes
General Progress Note    Name:  Ann Garcia   ZOXWR'U Date:  04/23/2017  Admission Date: 04/22/2017  LOS: 1 day                     Assessment/Plan:    Principal Problem:    Chest pain  Active Problems:    Tobacco use disorder    Chronic pain syndrome    Melena    61 year-old with a PMH significant for tobacco abuse and chronic pain on chronic opioids who was admitted 04/22/2017 for chest pain, R sided numbness, and intermittent melena and hematochezia.     Influenza:  - RVP positive on 12/18 with H1N1.   - Start Tamiflu. Droplet isolation.    R sided numbness and jaw pain: resolved.   - developed suddenly while at a stressful meeting today, so EMS was called  - was stroke activated in the ED  - CT head showed no evidence of acute intracranial hemorrhage or mass effect, but there was tiny chronic appearing right superior cerebellar infarct which was new since 2013; there was mild supratentorial white matter hypodensities, likely secondary to   chronic microvascular ischemic disease  - per neuro/stroke team, no acute stroke intervention was recommended with low suspicion for acute CVA at this time   - MRI of brain with previous CVA:   1. No acute/recent infarct or intracranial mass.  2. 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  3. Mild small FLAIR hyperintensities within the hemispheric white matter.   These are nonspecific, however are commonly secondary to chronic small   vessel ischemic changes in patients this age.    Chest pain  - began this morning after she got scared that a shelf was going to fall on her; reported associated nausea and dyspnea  - symptoms improved after she sat down to rest and smoke a cigar but returned during a stressful meeting later in the day and continues to be intermittent since then  - previous cardiac cath done 02/2010 here was normal  - EKG on admission with no acute ST-T wave changes  - troponin negative - Stress test 12/18: This study is probably normal. There is a small area of mild intensity fixed apical decrease tracer uptake noted in the upright rest and post stress images.  No significant reversible perfusion abnormalities seen.  No significant perfusion abnormalities seen in the supine post stress images.  Overall, study is suggestive of attenuation artifact.  Left ventricular systolic function is normal. There are no high risk prognostic indicators present.  The ECG portion of the study is nondiagnostic for ischemia due to baseline artifacts. Frequent premature ventricular complex beats.    Intermittent melena and hematochezia: symptoms suggestive of hemorrhoids.   - patient reports this has been ongoing since August  - has not had endoscopic evaluation done since 2006 and can't remember what her results were  - hgb stable and WNL on admission  - per ED, was hemoccult positive  - Gi consulted: plan for EGD/ Colon in AM.   - continue PPI PO BID    Acute on chronic pain on chronic opioid therapy  - pt perseverating on need for pain medications during admission interview  - reporting pain in her head, neck, back, legs and feet that is rated 11.5/10  - reports being on morphine ER 30 mg PO q12h at home, however per pharmacy, there has been no fill of this in [place] or [place] since 2016; did  have some Vicodin filled back in August   - no indication for IV narctoics as requested by the patient  - will not plan to continue morphine ER  - start Vicodin q4h PRN: while inpatient.     Tobacco abuse  - smokes 2-3 cigars per day  - will order nicotine patch and nicotine gum while inpatient per patient request     FEN  - No IV fluids   - lytes stable on admission   - regular diet -- EGD and Colonoscopy in AM, NPO at MN.     DVT prophylaxis  - SCDs  - no pharmacologic prophylaxis with concern for GI bleeding     Disposition  - Full Code -- confirmed with patient on admission   - Continue inpatient care. Complexity of decision making high. 35 minutes were spent with >50% time spent face to face or on floor.   ________________________________________________________________________    Subjective  Ann Garcia is a 61 y.o. female.  Patient was admitted with CP/ reported blood in stool. Overnight no events. Later in day, patient was reporting chills. No HA/Dizziness. No recurrent chest pain. Patient developed fever later in day and RVP was positive for Influenza.     Medications  Scheduled Meds:  albuterol (PROAIR HFA, VENTOLIN HFA, or PROVENTIL HFA) inhaler 2 puff 2 puff Inhalation QDAY   electrolyte GUT PEG (NULYTELY, COLYTE, GAVILYTE-N) oral solution 4 L 4 L Oral As Prescribed   [START ON 04/24/2017] influenza (=>6 MO)(QUADrivalent) (FLULAVAL) 60 mcg (15 mcg x 4)/0.5 mL 2018-19 PF syringe 0.5 mL 0.5 mL Intramuscular ONCE   nicotine (NICODERM CQ STEP 2) 14 mg/day patch 1 patch 1 patch Transdermal QDAY   pantoprazole DR (PROTONIX) tablet 40 mg 40 mg Oral BID(11-21)   Continuous Infusions:  PRN and Respiratory Meds:acetaminophen Q6H PRN, albuterol PRN, bisacodyl Once PRN, electrolyte GUT PEG PRN (On Call from Rx), HYDROcodone/acetaminophen Q4H PRN, nicotine polacrilex Q2H PRN, ondansetron (ZOFRAN) IV Q6H PRN      Objective:                          Vital Signs: Last Filed                 Vital Signs: 24 Hour Range   BP: 110/64 (12/18 2235)  Temp: 37.4 ???C (99.4 ???F) (12/18 2235)  Pulse: 104 (12/18 2235)  Respirations: 18 PER MINUTE (12/18 2235)  SpO2: 96 % (12/18 2235)  O2 Delivery: None (Room Air) (12/18 2235) BP: (110-132)/(59-81)   Temp:  [36.5 ???C (97.7 ???F)-38.7 ???C (101.6 ???F)]   Pulse:  [68-104]   Respirations:  [16 PER MINUTE-18 PER MINUTE]   SpO2:  [94 %-99 %]   O2 Delivery: None (Room Air)   Intensity Pain Scale (Self Report): 10 (04/23/17 2043) Vitals:    04/22/17 1527   Weight: 77.1 kg (170 lb)       Intake/Output Summary:  (Last 24 hours)  No intake or output data in the 24 hours ending 04/23/17 2335 Physical Exam  General: Well developed, alert, awake and oriented x3.   Head: Normocephalic, without obvious abnormality, atraumatic   Nose/Throat: Mucous membrane moist.   Eyes: Conjunctivae/corneas clear. PERRL, EOMs intact.   Neck: Supple, symmetrical, No JVD, No bruit   Lungs: Clear to auscultation bilaterally   Back: Non tender, no kyphosis or scoliosis  Heart: S1, S2 heard, Regular rate and rhythm, no murmur, click rub or gallop   Abdomen: Soft,  non-tender, non distended, Bowel sounds normal. No masses. No organomegaly.   Extremities: No cyanosis or clubbing. Pulses: 2+ and symmetric, all extremities.   Neurologic: CNII - XII intact. Normal strength, sensation, non focal exam   Skin: Clear, no rashes   Psyche: Normal affect      Lab Review  24-hour labs:    Results for orders placed or performed during the hospital encounter of 04/22/17 (from the past 24 hour(s))   CBC AND DIFF    Collection Time: 04/23/17  4:20 AM   Result Value Ref Range    White Blood Cells 6.2 4.5 - 11.0 K/UL    RBC 3.95 (L) 4.0 - 5.0 M/UL    Hemoglobin 12.1 12.0 - 15.0 GM/DL    Hematocrit 16.1 (L) 36 - 45 %    MCV 90.9 80 - 100 FL    MCH 30.8 26 - 34 PG    MCHC 33.8 32.0 - 36.0 G/DL    RDW 09.6 (H) 11 - 15 %    Platelet Count 239 150 - 400 K/UL    MPV 7.2 7 - 11 FL    Neutrophils 57 41 - 77 %    Lymphocytes 36 24 - 44 %    Monocytes 4 4 - 12 %    Eosinophils 2 0 - 5 %    Basophils 1 0 - 2 %    Absolute Neutrophil Count 3.60 1.8 - 7.0 K/UL    Absolute Lymph Count 2.20 1.0 - 4.8 K/UL    Absolute Monocyte Count 0.30 0 - 0.80 K/UL    Absolute Eosinophil Count 0.10 0 - 0.45 K/UL    Absolute Basophil Count 0.00 0 - 0.20 K/UL   BASIC METABOLIC PANEL    Collection Time: 04/23/17  4:20 AM   Result Value Ref Range    Sodium 138 137 - 147 MMOL/L    Potassium 3.8 3.5 - 5.1 MMOL/L    Chloride 107 98 - 110 MMOL/L    CO2 24 21 - 30 MMOL/L    Anion Gap 7 3 - 12    Glucose 97 70 - 100 MG/DL    Blood Urea Nitrogen 21 7 - 25 MG/DL Creatinine 0.45 0.4 - 4.09 MG/DL    Calcium 9.0 8.5 - 81.1 MG/DL    eGFR Non African American >60 >60 mL/min    eGFR African American >60 >60 mL/min   MAGNESIUM    Collection Time: 04/23/17  4:20 AM   Result Value Ref Range    Magnesium 2.1 1.6 - 2.6 mg/dL   RVP VIRAL PANEL PCR    Collection Time: 04/23/17  3:15 PM   Result Value Ref Range    Specimen Source       NASAL WASH  This assay uses analyte specific reagents and has not been cleared by the [place]   Food and Drug Administration.  The performance characteristics were determined   by the [facility].      Adenovirus NOT DETECTED     Coronavirus 229E NOT DETECTED     Coronavirus HKU1 NOT DETECTED     Coronavirus NL63 NOT DETECTED     Coronavirus OC43 NOT DETECTED     Human Metapneumovirus NOT DETECTED     Human Rhinovirus/ENTEROVIRUS NOT DETECTED     Influenza A H1N1 2009 DETECTED     Influenza A H1 NOT DETECTED     Influenza A H3 NOT DETECTED     Influenza B NOT  DETECTED     Parainfluenza 1 NOT DETECTED     Parainfluenza 2 NOT DETECTED     Parainfluenza 3 NOT DETECTED     Parainfluenza 4 NOT DETECTED     RSV NOT DETECTED     Bordetella Pertussis NOT DETECTED     Chlamydophila Pneumoniae NOT DETECTED     Mycoplasma Pneumoniae NOT DETECTED        Point of Care Testing  (Last 24 hours)  Glucose: 97 (04/23/17 0420)    Radiology and other Diagnostics Review:    Pertinent radiology reviewed.    Mri Head Wo/w Contrast    Result Date: 04/23/2017  1. No acute/recent infarct or intracranial mass. 2. 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 3. Mild small FLAIR hyperintensities within the hemispheric white matter. These are nonspecific, however are commonly secondary to chronic small vessel ischemic changes in patients this age.  Finalized by Marily Memos, M.D. on 04/23/2017 7:49 AM. Dictated by Marily Memos, M.D. on 04/23/2017 7:43 AM.     Shelba Flake, MD   Pager [phone]

## 2017-04-24 NOTE — Progress Notes
Patient arrived to room [id number] via wheelchair accompanied by RN. Patient transferred to the bed without assistance. Bedside safety checks completed. Initial patient assessment completed, refer to flowsheet for details. Admission skin assessment completed by:     Pressure Injury Present on Hospital Admission (within 24 hours): No    1. Occiput: No  2. Ear: No  3. Scapula: No  4. Spinous Process: No  5. Shoulder: No  6. Elbow: No  7. Iliac Crest: No  8. Sacrum/Coccyx: No  9. Ischial Tuberosity: No  10. Trochanter: No  11. Knee: No  12. Malleolus: No  13. Heel: No  14. Toes: No  15. Assessed for device associated injury Yes  16. Nursing Nutrition Assessment Completed Yes    See Doc Flowsheet for additional wound details.     INTERVENTIONS:

## 2017-04-24 NOTE — Progress Notes
Patient has a temperature of 102.8, MD notified Given PRN dose of 650mg  of acetaminophen.  RN notified Med Private D MD Shelba Flake  about patients sustaining tachycardia since 1700.   Patient is also refusing GI prep due to generalized pain, discomfort and flu like symptoms.

## 2017-04-25 ENCOUNTER — Inpatient Hospital Stay: Admit: 2017-04-23 | Discharge: 2017-04-23 | Payer: MEDICARE

## 2017-04-25 ENCOUNTER — Encounter: Admit: 2017-04-25 | Discharge: 2017-04-25 | Payer: MEDICARE

## 2017-04-25 ENCOUNTER — Inpatient Hospital Stay: Admit: 2017-04-22 | Discharge: 2017-04-25 | Disposition: A | Discharge status: Home / Self Care | Payer: MEDICARE

## 2017-04-25 ENCOUNTER — Emergency Department: Admit: 2017-04-22 | Discharge: 2017-04-22 | Payer: MEDICARE

## 2017-04-25 DIAGNOSIS — Z86711 Personal history of pulmonary embolism: ICD-10-CM

## 2017-04-25 DIAGNOSIS — Z6833 Body mass index (BMI) 33.0-33.9, adult: ICD-10-CM

## 2017-04-25 DIAGNOSIS — Z8673 Personal history of transient ischemic attack (TIA), and cerebral infarction without residual deficits: ICD-10-CM

## 2017-04-25 DIAGNOSIS — R200 Anesthesia of skin: ICD-10-CM

## 2017-04-25 DIAGNOSIS — R05 Cough: ICD-10-CM

## 2017-04-25 DIAGNOSIS — I252 Old myocardial infarction: ICD-10-CM

## 2017-04-25 DIAGNOSIS — R6884 Jaw pain: ICD-10-CM

## 2017-04-25 DIAGNOSIS — G894 Chronic pain syndrome: ICD-10-CM

## 2017-04-25 DIAGNOSIS — I2510 Atherosclerotic heart disease of native coronary artery without angina pectoris: ICD-10-CM

## 2017-04-25 DIAGNOSIS — F17290 Nicotine dependence, other tobacco product, uncomplicated: ICD-10-CM

## 2017-04-25 DIAGNOSIS — R509 Fever, unspecified: ICD-10-CM

## 2017-04-25 DIAGNOSIS — F329 Major depressive disorder, single episode, unspecified: ICD-10-CM

## 2017-04-25 DIAGNOSIS — Z23 Encounter for immunization: ICD-10-CM

## 2017-04-25 DIAGNOSIS — K920 Hematemesis: ICD-10-CM

## 2017-04-25 DIAGNOSIS — J101 Influenza due to other identified influenza virus with other respiratory manifestations: ICD-10-CM

## 2017-04-25 DIAGNOSIS — R0600 Dyspnea, unspecified: ICD-10-CM

## 2017-04-25 DIAGNOSIS — E669 Obesity, unspecified: ICD-10-CM

## 2017-04-25 DIAGNOSIS — R29702 NIHSS score 2: ICD-10-CM

## 2017-04-25 DIAGNOSIS — K649 Unspecified hemorrhoids: ICD-10-CM

## 2017-04-25 DIAGNOSIS — R079 Chest pain, unspecified: Principal | ICD-10-CM

## 2017-04-25 DIAGNOSIS — Z79891 Long term (current) use of opiate analgesic: ICD-10-CM

## 2017-04-25 DIAGNOSIS — Z86718 Personal history of other venous thrombosis and embolism: ICD-10-CM

## 2017-04-25 DIAGNOSIS — R000 Tachycardia, unspecified: ICD-10-CM

## 2017-04-25 DIAGNOSIS — K921 Melena: ICD-10-CM

## 2017-04-25 DIAGNOSIS — E785 Hyperlipidemia, unspecified: ICD-10-CM

## 2017-04-25 DIAGNOSIS — E119 Type 2 diabetes mellitus without complications: ICD-10-CM

## 2017-04-25 DIAGNOSIS — I998 Other disorder of circulatory system: ICD-10-CM

## 2017-04-25 DIAGNOSIS — Z590 Homelessness: ICD-10-CM

## 2017-04-25 DIAGNOSIS — I493 Ventricular premature depolarization: ICD-10-CM

## 2017-04-25 DIAGNOSIS — A419 Sepsis, unspecified organism: ICD-10-CM

## 2017-04-25 LAB — MAGNESIUM: Lab: 2.1 mg/dL — ABNORMAL LOW (ref >60)

## 2017-04-25 LAB — CBC AND DIFF
Lab: 12 g/dL (ref >60)
Lab: 4.2 K/UL — ABNORMAL LOW (ref 4.5–11.0)
Lab: 91 FL (ref >60)

## 2017-04-25 LAB — BASIC METABOLIC PANEL: Lab: 138 MMOL/L — ABNORMAL LOW (ref 137–147)

## 2017-04-25 MED ORDER — PANTOPRAZOLE 40 MG PO TBEC
40 mg | ORAL_TABLET | Freq: Every day | ORAL | 3 refills | 90.00000 days | Status: AC
Start: 2017-04-25 — End: 2017-04-25
  Filled 2017-04-25 (×2): qty 90, 90d supply, fill #1

## 2017-04-25 MED ORDER — NICOTINE (POLACRILEX) 4 MG BU GUM
4 mg | BUCCAL | 3 refills | Status: AC | PRN
Start: 2017-04-25 — End: 2017-04-25

## 2017-04-25 MED ORDER — NICOTINE (POLACRILEX) 4 MG BU GUM
4 mg | BUCCAL | 3 refills | Status: AC | PRN
Start: 2017-04-25 — End: ?

## 2017-04-25 MED ORDER — NICOTINE 14 MG/24 HR TD PT24
1 | MEDICATED_PATCH | Freq: Every day | TRANSDERMAL | 3 refills | Status: AC
Start: 2017-04-25 — End: ?

## 2017-04-25 MED ORDER — NICOTINE 14 MG/24 HR TD PT24
1 | MEDICATED_PATCH | Freq: Every day | TRANSDERMAL | 3 refills | Status: AC
Start: 2017-04-25 — End: 2017-04-25

## 2017-04-25 MED ORDER — PANTOPRAZOLE 40 MG PO TBEC
40 mg | ORAL_TABLET | Freq: Every day | ORAL | 3 refills | 90.00000 days | Status: AC
Start: 2017-04-25 — End: ?

## 2017-04-25 MED ORDER — OSELTAMIVIR 75 MG PO CAP
75 mg | ORAL_CAPSULE | Freq: Two times a day (BID) | ORAL | 0 refills | Status: AC
Start: 2017-04-25 — End: ?
  Filled 2017-04-25 (×2): qty 6, 3d supply, fill #1

## 2017-04-25 MED ORDER — OSELTAMIVIR 75 MG PO CAP
75 mg | ORAL_CAPSULE | Freq: Two times a day (BID) | ORAL | 0 refills | Status: AC
Start: 2017-04-25 — End: 2017-04-25

## 2017-04-25 NOTE — Progress Notes
Plans for EGD and colonoscopy canceled as the patient is acutely ill with the flu and not able to tolerate prep.  Spoke to medicine attending and we will plan to do these as an outpatient.  I will schedule them in 1-2 months and she should get a phone call.  Given her reported melena and hematochezia suggest that she have hemoglobin checked as an outpatient with her primary care doctor.  Please call us if she develops any overt GI bleeding while in the hospital.     We do not have any further diagnostics planned for this inpatient stay and will sign off. Please don't hesitate to contact GI should additional questions arise.     Murvin Donning, MD   GI/Hepatology Fellow [phone]

## 2017-04-25 NOTE — Progress Notes
Discharge day progress note    Patient seen and examined. No overnight events. No complains. Last fever at 2 Pm y'day. Feeling better, cough is getting better.     VSS, Exam: Lungs CTAB.     Labs: reviewed.     Dispo;  - Discharge patient home today after 2 PM.    - Discharge on PO Tamiflu for 3 more days.   - No indication for ASA 325 or even ASA 81 as she has no cardiac risk factors. Will d/c at discharge.   - No Advil.   - Requested OP EGD/ Colon, informed GI fellow.     35 minutes were spent in discharge planning and coordinating discharge.     Shelba Flake, MD  [phone]

## 2017-04-25 NOTE — Case Management (ED)
Inpatient Medication Voucher Assessment    Date:  04/25/2017    Primary Insurance: Yes  Rx Coverage: No  Medicaid pending: No  Medicare Part D [place]: No  VA Benefits (include location of services if applicable): No    Is patient eligible for a Medication Voucher? Yes     Date of last Medication Voucher: None   Charitable Fund to be utilized (include name of fund if applicable): No   Narcotics can be included on voucher: No   Other information:      Reminders:    SW has authorized a Medication Voucher for up to $125.  If Voucher is over this amount, please contact SW.     Vouchers are to be provided once during a 52-month period.    Vouchers cannot include medications on the Generic Drug List ($4.99) or co-pays.   Vouchers should include only new medications; ongoing refills should not be authorized.  No more than a 30-day supply should be authorized. Please contact SW with needs outside of these parameters.      Dellia Beckwith, LMSW  Pager/Cell: [phone]

## 2017-04-25 NOTE — Discharge Instructions - Appointments
Follow up with your PCP in 2-3 weeks as OP.

## 2017-04-25 NOTE — Progress Notes
RT Adult Assessment Note    NAME:Ann Garcia             MRN: [id number]             DOB:08/17/1955          AGE: 61 y.o.  ADMISSION DATE: 04/22/2017             DAYS ADMITTED: LOS: 3 days    RT Treatment Plan:  Protocol Plan: Medications  Albuterol: MDI Q day(home reg)    Protocol Plan: Procedures  IPPB: Place a nursing order for "IS Q1h While Awake" for any of Lung Expansion indicators    Additional Comments:  Impressions of the patient: pt resting in bed on RA  Intervention(s)/outcome(s): given AM inhaler of alb  Patient education that was completed:   Recommendations to the care team: monitor respiratory status     Vital Signs:  Pulse: Pulse: 70  RR: Respirations: 16 PER MINUTE  SpO2: SpO2: 95 %  O2 Device:    Liter Flow:    O2%: O2 Percent: 21 %  Breath Sounds:    Respiratory Effort: Respiratory Effort: Non-Labored

## 2017-04-25 NOTE — Progress Notes
All d/c instructions given to pt. Reviewed activity, diet, medications, follow up appointments, s/s of infection, s/s to report, and incision care. PIV removed without any complications. All belongings packed and sent with pt. All questions or complaints addressed, pt verbalizes understanding. Taken to lobby via wheelchair.

## 2017-04-26 ENCOUNTER — Ambulatory Visit: Admit: 2017-04-26 | Discharge: 2017-04-26 | Payer: MEDICARE

## 2017-04-27 NOTE — Discharge Instructions - Pharmacy
Physician Discharge Summary      Name: Ann Garcia  Medical Record Number: [id number]        Account Number:  [id number]  Date Of Birth:  Dec 27, 1955                         Age:  61 years   Admit date:  04/22/2017                     Discharge date:  04/25/2017    Attending Physician: Shelba Flake, MD               service: Med Private D- [phone]    Physician Summary completed by: Shelba Flake, MD    Reason for hospitalization: Chest pain right-sided facial numbness and intermittent melena    Significant PMH:   Past Medical History:   Diagnosis Date   ??? Back pain    ??? Chest pain     jaw pain, right side of neck relieved nitro or laying down   ??? Depression (disease)    ??? DM (diabetes mellitus) (HCC)    ??? Dyslipidemia 02/20/2010   ??? Dyspnea    ??? Edema    ??? History of DVT (deep vein thrombosis) 02/20/2010   ??? History of pulmonary embolism 02/02/2010   ??? Hyperlipemia    ??? MI (myocardial infarction) (HCC)    ??? Murmur    ??? Obesity    ??? PE (pulmonary embolism)    ??? Postmenopausal    ??? Tobacco use disorder 02/20/2010   ??? Unspecified cardiovascular disease    ??? Unspecified disorders of nervous system    ??? Unspecified endocrine disorder        Allergies: Amitriptyline; Amitriptyline; Amoxicillin; Ativan [lorazepam]; Divalproex; Erythromycin; Erythromycin; Penicillins; Penicillins; Relafen [nabumetone]; Sulfa (sulfonamide antibiotics); Sulfa (sulfonamide antibiotics); Sulfamethoxazole-trimethoprim; Sulfamethoxazole-trimethoprim; Tetracycline; Tetracycline; Toradol [ketorolac]; and Wellbutrin [bupropion hcl]    Admission Physical Exam notable for:      Vital Signs: Last Filed In 24 Hours Vital Signs: 24 Hour Range   BP: 116/89 (12/17 1830)  Temp: 36 ???C (96.8 ???F) (12/17 1555)  Pulse: 75 (12/17 1600)  Respirations: 16 PER MINUTE (12/17 2000)  SpO2: 98 % (12/17 2000)  O2 Delivery: None (Room Air) (12/17 2000)  SpO2 Pulse: 72 (12/17 2000)  Height: 160 cm (63) (12/17 1527) BP: (105-128)/(63-89) Temp:  [36 ???C (96.8 ???F)-37.2 ???C (98.9 ???F)]   Pulse:  [75-81]   Respirations:  [14 PER MINUTE-18 PER MINUTE]   SpO2:  [95 %-100 %]   O2 Delivery: None (Room Air)   Intensity Pain Scale (Self Report): 10 (04/22/17 1723) ???   ???  General:  Alert, cooperative, no distress, appears stated age  Head:  Normocephalic, without obvious abnormality, atraumatic  Eyes:  Conjunctivae clear.  PERRL, EOMs intact.    Throat: Lips, mucosa and tongue normal.  Teeth and gums normal  Lungs:  Clear to auscultation bilaterally; no wheezes or rales   Heart:   Regular rate and rhythm, no murmur  Abdomen:  Soft, non-tender.  Bowel sounds normal.    Extremities: Extremities normal, atraumatic, no cyanosis or edema  Peripheral pulses   2+ and symmetric, all extremities  Neurologic:   CNII - XII intact.  No gross focal deficits, although pt reports decreased sensation on the right side   ???            Admission Lab/Radiology studies notable for:     24-hour  labs:          Results for orders placed or performed during the hospital encounter of 04/22/17 (from the past 24 hour(s))   CBC AND DIFF   ??? Collection Time: 04/22/17  3:35 PM   Result Value Ref Range   ??? White Blood Cells 7.4 4.5 - 11.0 K/UL   ??? RBC 4.30 4.0 - 5.0 M/UL   ??? Hemoglobin 13.3 12.0 - 15.0 GM/DL   ??? Hematocrit 39.3 36 - 45 %   ??? MCV 91.5 80 - 100 FL   ??? MCH 30.8 26 - 34 PG   ??? MCHC 33.7 32.0 - 36.0 G/DL   ??? RDW 14.8 11 - 15 %   ??? Platelet Count 279 150 - 400 K/UL   ??? MPV 7.7 7 - 11 FL   ??? Neutrophils 51 41 - 77 %   ??? Lymphocytes 42 24 - 44 %   ??? Monocytes 4 4 - 12 %   ??? Eosinophils 2 0 - 5 %   ??? Basophils 1 0 - 2 %   ??? Absolute Neutrophil Count 3.80 1.8 - 7.0 K/UL   ??? Absolute Lymph Count 3.10 1.0 - 4.8 K/UL   ??? Absolute Monocyte Count 0.30 0 - 0.80 K/UL   ??? Absolute Eosinophil Count 0.10 0 - 0.45 K/UL   ??? Absolute Basophil Count 0.00 0 - 0.20 K/UL   BASIC METABOLIC PANEL   ??? Collection Time: 04/22/17  3:35 PM   Result Value Ref Range   ??? Sodium 138 137 - 147 MMOL/L ??? Potassium 4.1 3.5 - 5.1 MMOL/L   ??? Chloride 103 98 - 110 MMOL/L   ??? CO2 26 21 - 30 MMOL/L   ??? Anion Gap 9 3 - 12   ??? Glucose 84 70 - 100 MG/DL   ??? Blood Urea Nitrogen 22 7 - 25 MG/DL   ??? Creatinine 0.70 0.4 - 1.00 MG/DL   ??? Calcium 9.7 8.5 - 10.6 MG/DL   ??? eGFR Non African American >60 >60 mL/min   ??? eGFR African American >60 >60 mL/min   LIVER FUNCTION PANEL   ??? Collection Time: 04/22/17  3:35 PM   Result Value Ref Range   ??? Total Bilirubin 0.2 (L) 0.3 - 1.2 MG/DL   ??? Bilirubin, Direct <0.1 <0.4 MG/DL   ??? Albumin 4.6 3.5 - 5.0 G/DL   ??? Alk Phosphatase 66 25 - 110 U/L   ??? AST (SGOT) 17 7 - 40 U/L   ??? ALT (SGPT) 13 7 - 56 U/L   ??? Total Protein 7.5 6.0 - 8.0 G/DL   AMPHETAMINES-URINE RANDOM   ??? Collection Time: 04/22/17  4:30 PM   Result Value Ref Range   ??? Amphetamines NEG NEG-NEG   BARBITURATES-URINE RANDOM   ??? Collection Time: 04/22/17  4:30 PM   Result Value Ref Range   ??? Barbiturates,Urine NEG NEG-NEG   BENZODIAZEPINES-URINE RANDOM   ??? Collection Time: 04/22/17  4:30 PM   Result Value Ref Range   ??? Benzodiazepines NEG NEG-NEG   CANNABINOIDS-URINE RANDOM   ??? Collection Time: 04/22/17  4:30 PM   Result Value Ref Range   ??? THC NEG NEG-NEG   COCAINE-URINE RANDOM   ??? Collection Time: 04/22/17  4:30 PM   Result Value Ref Range   ??? Cocaine-Urine NEG NEG-NEG   OPIATES-URINE RANDOM   ??? Collection Time: 04/22/17  4:30 PM   Result Value Ref Range   ??? Opiates-Urine NEG NEG-NEG  PHENCYCLIDINES-URINE RANDOM   ??? Collection Time: 04/22/17  4:30 PM   Result Value Ref Range   ??? Phencyclidine (PCP) NEG NEG-NEG   URINALYSIS DIPSTICK   ??? Collection Time: 04/22/17  4:30 PM   Result Value Ref Range   ??? Color,UA STRAW ???   ??? Turbidity,UA CLEAR CLEAR-CLEAR   ??? Specific Gravity-Urine 1.012 1.003 - 1.035   ??? pH,UA 6.0 5.0 - 8.0   ??? Protein,UA NEG NEG-NEG   ??? Glucose,UA NEG NEG-NEG   ??? Ketones,UA NEG NEG-NEG   ??? Bilirubin,UA NEG NEG-NEG   ??? Blood,UA NEG NEG-NEG   ??? Urobilinogen,UA NORMAL NORM-NORMAL   ??? Nitrite,UA NEG NEG-NEG ??? Leukocytes,UA NEG NEG-NEG   ??? Urine Ascorbic Acid, UA NEG NEG-NEG   URINALYSIS, MICROSCOPIC   ??? Collection Time: 04/22/17  4:30 PM   Result Value Ref Range   ??? WBCs,UA NONE 0 - 2 /HPF   ??? RBCs,UA 0-2 0 - 3 /HPF   SED RATE   ??? Collection Time: 04/22/17  5:27 PM   Result Value Ref Range   ??? Sed Rate -ESR 20 0 - 30 MM/HR   POC TROPONIN   ??? Collection Time: 04/22/17  5:28 PM   Result Value Ref Range   ??? Troponin-I-POC 0.01 0.00 - 0.05 NG/ML   ???  Glucose: 84 (04/22/17 1535)  Hemoccult: (S) Positive (04/22/17 1613)   ???  Pertinent radiology reviewed.   ???  CT head (04/22/2017):  1. No evidence of acute intracranial hemorrhage or mass effect.  2. 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  3. Mild supratentorial white matter hypodensities, likely secondary to   chronic microvascular ischemic disease.    MRI of the brain: 04/23/2017      1. No acute/recent infarct or intracranial mass.  2. 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  3. Mild small FLAIR hyperintensities within the hemispheric white matter.   These are nonspecific, however are commonly secondary to chronic small   vessel ischemic changes in patients this age.    Brief Hospital Course:      61 year-old with a PMH significant for tobacco abuse and chronic pain on chronic opioids who was admitted 04/22/2017 for chest pain, R sided numbness, and intermittent melena and hematochezia.  Patient had a fever after arrival in the hospital and was found to be positive for influenza.  She met sepsis criteria with fever and tachycardia with influenza infection.  She had right-sided numbness of the joint pain on arrival and was stroke alerted in the emergency department and CT did not show any acute intracranial abnormality.  MRI of the brain was obtained which showed small old bilateral cerebellar infarct.  Chronic small vessel ischemic changes were noted on MRI.  For her chest pain she underwent stress testing which was negative for any ischemia.  For the melena, she was scheduled for EGD and colonoscopy however because of the fever and coughing with phlegm production with the flu, she could not do the preparation for EGD and colonoscopy and was advised to do it as an outpatient.  Her hemoglobin was greater than 12 all the time and never dropped which was close to her baseline and the symptom was a stable hemorrhoids.  Patient was living at a homeless shelter and was discharged back to homeless shelter.    Of note on arrival, patient reported she was taking long-acting morphine though on M- tracks as well as K tracks, only refill for  few Percocet 5/325 were noted.  Discharged in stable condition to finish treatment for flu with Tamiflu    Condition at Discharge: Stable    Discharge Diagnoses:     Hospital Problems        Active Problems    * (Principal) Chest pain    Tobacco use disorder    Chronic pain syndrome    Melena          Surgical Procedures: None    Significant Diagnostic Studies and Procedures: noted in brief hospital course    Consults:  GI and Neurology    Patient Disposition: Home       Patient instructions/medications:      AMB REFERRAL TO GI LAB FOR PROCEDURE   Referral Type: Consult, Test & Treat   Referral Reason: Specialty Services Required   Number of Visits Requested: 1 Expiration Date: 04/25/18     Activity as Tolerated    It is important to keep increasing your activity level after you leave the hospital.  Moving around can help prevent blood clots, lung infection (pneumonia) and other problems.  Gradually increasing the number of times you are up moving around will help you return to your normal activity level more quickly.  Continue to increase the number of times you are up to the chair and walking daily to return to your normal activity level. Begin to work toward your normal activity level at discharge     Report These Signs and Symptoms Please contact your doctor if you have any of the following symptoms: temperature higher than 100 degrees F, uncontrolled pain, persistent nausea and/or vomiting, difficulty breathing, chest pain, severe abdominal pain, headache, unable to urinate, unable to have bowel movement or drainage with a foul odor     Questions About Your Stay    For questions or concerns regarding your hospital stay. Call [phone]     Discharging attending physician: Shelba Flake [[id number]]      Regular Diet    You have no dietary restriction. Please continue with a healthy balanced diet.      Current Discharge Medication List       START taking these medications    Details   nicotine (NICODERM CQ STEP 2) 14 mg/day patch Apply one patch to top of skin as directed daily. Rotate patch location.  Qty: 28 patch, Refills: 3    PRESCRIPTION TYPE:  Normal      nicotine polacrilex (NICORETTE) 4 mg gum Place one each inside cheek (side of mouth) every 2 hours as needed. Chew to soften and park in mouth  for nicotine carving.  Qty: 2 box, Refills: 3    PRESCRIPTION TYPE:  Normal      oseltamivir (TAMIFLU) 75 mg capsule Take one capsule by mouth twice daily.  Qty: 6 capsule, Refills: 0    PRESCRIPTION TYPE:  Normal      pantoprazole DR (PROTONIX) 40 mg tablet Take one tablet by mouth daily.  Qty: 90 tablet, Refills: 3    PRESCRIPTION TYPE:  Normal          CONTINUE these medications which have NOT CHANGED    Details   acetaminophen (TYLENOL) 500 mg tablet Take 500 mg by mouth as Needed for Pain. Max of 4,000 mg of acetaminophen in 24 hours.    PRESCRIPTION TYPE:  Historical Med      albuterol (PROVENTIL; VENTOLIN) 90 mcg/actuation inhaler Inhale 2 Puffs by mouth every 6 hours as needed.  PRESCRIPTION TYPE:  Historical Med      calcium carbonate (TUMS) 500 mg (200 mg elemental calcium) chewable tablet Chew 500 mg by mouth as Needed.    PRESCRIPTION TYPE:  Historical Med      cyanocobalamin (vitamin B-12) (VITAMIN B-12 PO) Take  by mouth. PRESCRIPTION TYPE:  Historical Med      diphenhydrAMINE (BENADRYL ALLERGY) 25 mg tablet Take 25 mg by mouth as Needed.    PRESCRIPTION TYPE:  Historical Med      hydrocortisone (CORTIZONE-10) 1 % topical cream Apply  topically to affected area as Needed.    PRESCRIPTION TYPE:  Historical Med      Menthol 5 % ptmd Apply  topically to affected area.    PRESCRIPTION TYPE:  Historical Med      menthol/camphor (BIOFREEZE TP) Apply  topically to affected area.    PRESCRIPTION TYPE:  Historical Med      multivitamin (MULTIPLE VITAMIN PO) Take  by mouth.    PRESCRIPTION TYPE:  Historical Med      nitroglycerin (NITROSTAT) 0.4 mg tablet Place 0.4 mg under tongue every 5 minutes as needed.    PRESCRIPTION TYPE:  Historical Med      peg 400/hypromellose/glycerin (VISINE DRY EYE RELIEF OP) Place  into or around eye(s).    PRESCRIPTION TYPE:  Historical Med          The following medications were removed from your list. This list includes medications discontinued this stay and those removed from your prior med list in our system        aspirin 325 mg tablet        ibuprofen (ADVIL) 200 mg tablet        morphine ER (AVINZA) 30 mg PO capsule               Scheduled appointments:     Follow up with your PCP in 2-3 weeks as OP.                  Pending items needing follow up: Outpatient EGD and colonoscopy    Signed:  Shelba Flake, MD  04/26/2017      cc:  Primary Care Physician:  Fawn Kirk   Verified  Referring physicians:  Self, Referral   Additional provider(s):

## 2017-04-29 LAB — CULTURE-BLOOD W/SENSITIVITY

## 2017-05-10 NOTE — Progress Notes
Called to prescreen for her EGD and Colonoscopy as requested by the GI Fellow - spoke to patient. Ann Garcia states she is still "not well at all" and is not feeling any better since she was seen in the ED. Requested a call back on Monday to schedule her procedures then. Pt strongly encouraged to call her doctor to let them know she continues to be ill. She reports understanding.

## 2017-05-13 ENCOUNTER — Ambulatory Visit: Admit: 2017-05-13 | Discharge: 2017-05-13 | Payer: MEDICARE

## 2017-05-14 ENCOUNTER — Encounter: Admit: 2017-05-14 | Discharge: 2017-05-14 | Payer: MEDICARE

## 2017-06-21 NOTE — Progress Notes
Called pt. To give instructions for her Colonoscopy and EGD. Patient stated she only wants to have the upper done now due to her disabilities. Changed to just EGD. Reviewed instructions with patient. Instructions for EGD mailed to patients home.

## 2017-07-03 ENCOUNTER — Encounter: Admit: 2017-07-03 | Discharge: 2017-07-03 | Payer: MEDICARE

## 2017-07-04 ENCOUNTER — Encounter: Admit: 2017-07-04 | Discharge: 2017-07-04 | Payer: MEDICARE

## 2017-10-25 ENCOUNTER — Emergency Department: Admit: 2017-10-25 | Discharge: 2017-10-25 | Payer: MEDICARE

## 2017-10-25 ENCOUNTER — Emergency Department: Admit: 2017-10-25 | Discharge: 2017-10-26 | Disposition: A | Discharge status: Home / Self Care | Payer: MEDICARE

## 2017-10-25 DIAGNOSIS — R079 Chest pain, unspecified: Secondary | ICD-10-CM

## 2017-10-25 LAB — POC TROPONIN
Lab: 0 ng/mL (ref 0.00–0.05)
Lab: 0 ng/mL (ref 0.00–0.05)

## 2017-10-25 LAB — URINALYSIS, MICROSCOPIC

## 2017-10-25 LAB — LIPASE: Lab: 14 U/L (ref 11–82)

## 2017-10-25 LAB — URINALYSIS DIPSTICK
Lab: NEGATIVE
Lab: NEGATIVE
Lab: NEGATIVE 10*3/uL (ref 0–0.20)
Lab: NEGATIVE mL/min (ref 0–0.80)
Lab: NEGATIVE

## 2017-10-25 LAB — DIRECT EXAM (WET PREP)

## 2017-10-25 LAB — COMPREHENSIVE METABOLIC PANEL
Lab: 0.5 mg/dL (ref 0.3–1.2)
Lab: 0.6 mg/dL — ABNORMAL HIGH (ref 0.4–1.00)
Lab: 108 MMOL/L (ref 98–110)
Lab: 138 MMOL/L (ref 137–147)
Lab: 7.7 g/dL (ref 6.0–8.0)
Lab: 87 mg/dL (ref 70–100)
Lab: 9.5 mg/dL (ref 8.5–10.6)

## 2017-10-25 LAB — CBC AND DIFF
Lab: 4.6 M/UL (ref 4.0–5.0)
Lab: 6.8 10*3/uL (ref 4.5–11.0)

## 2017-10-25 LAB — PROTIME INR (PT): Lab: 1 /HPF (ref 0.8–1.2)

## 2017-10-25 LAB — D-DIMER: Lab: 314 ng{FEU}/mL (ref <500)

## 2017-10-25 LAB — BNP POC ER: Lab: <15 pg/mL (ref 0–100)

## 2017-10-25 MED ORDER — FENTANYL CITRATE (PF) 50 MCG/ML IJ SOLN
50 ug | INTRAVENOUS | 0 refills | Status: DC | PRN
Start: 2017-10-25 — End: 2017-10-26
  Administered 2017-10-25 (×2): 50 ug via INTRAVENOUS

## 2017-10-25 MED ORDER — LIDOCAINE 5 % TP PTMD
1 | Freq: Once | TOPICAL | 0 refills | Status: DC
Start: 2017-10-25 — End: 2017-10-26
  Administered 2017-10-25: 22:00:00 1 via TOPICAL

## 2017-10-25 MED ORDER — LIDOCAINE 5 % TP PTMD
1 | MEDICATED_PATCH | TOPICAL | 0 refills | 30.00000 days | Status: AC | PRN
Start: 2017-10-25 — End: ?

## 2017-10-25 MED ORDER — AZITHROMYCIN 250 MG PO TAB
1000 mg | Freq: Once | ORAL | 0 refills | Status: CP
Start: 2017-10-25 — End: ?
  Administered 2017-10-26: 02:00:00 1000 mg via ORAL

## 2017-10-25 MED ORDER — CEFTRIAXONE (IM) 250 MG/1 ML (LIDOCAINE) ED ONLY
250 mg | Freq: Once | INTRAMUSCULAR | 0 refills | Status: CP
Start: 2017-10-25 — End: ?
  Administered 2017-10-26: 02:00:00 250 mg via INTRAMUSCULAR

## 2017-10-25 MED ORDER — METRONIDAZOLE 500 MG PO TAB
500 mg | ORAL_TABLET | Freq: Two times a day (BID) | ORAL | 0 refills | Status: AC
Start: 2017-10-25 — End: ?

## 2017-10-25 MED ORDER — METRONIDAZOLE 500 MG PO TAB
500 mg | Freq: Once | ORAL | 0 refills | Status: CP
Start: 2017-10-25 — End: ?
  Administered 2017-10-25: 22:00:00 500 mg via ORAL

## 2017-10-25 MED ORDER — ASPIRIN 81 MG PO CHEW
324 mg | Freq: Once | ORAL | 0 refills | Status: CP
Start: 2017-10-25 — End: ?
  Administered 2017-10-25: 20:00:00 324 mg via ORAL

## 2017-10-25 MED ORDER — NICOTINE 21 MG/24 HR TD PT24
1 | Freq: Once | TRANSDERMAL | 0 refills | Status: DC
Start: 2017-10-25 — End: 2017-10-26
  Administered 2017-10-25: 22:00:00 1 via TRANSDERMAL

## 2017-10-26 DIAGNOSIS — Z202 Contact with and (suspected) exposure to infections with a predominantly sexual mode of transmission: ICD-10-CM

## 2017-10-26 DIAGNOSIS — N760 Acute vaginitis: ICD-10-CM

## 2017-10-26 DIAGNOSIS — R0602 Shortness of breath: ICD-10-CM

## 2017-10-26 DIAGNOSIS — R079 Chest pain, unspecified: Principal | ICD-10-CM

## 2017-10-26 LAB — CHLAM/NG PCR SWAB
Lab: NEGATIVE
Lab: NEGATIVE

## 2018-07-01 ENCOUNTER — Ambulatory Visit (INDEPENDENT_AMBULATORY_CARE_PROVIDER_SITE_OTHER): Admitting: Internal Medicine

## 2018-07-01 ENCOUNTER — Telehealth: Payer: Self-pay | Admitting: Internal Medicine

## 2018-07-01 ENCOUNTER — Encounter: Payer: Self-pay | Admitting: Internal Medicine

## 2018-07-01 VITALS — BP 118/82 | HR 76 | Ht 70.0 in | Wt 207.2 lb

## 2018-07-01 DIAGNOSIS — I10 Essential (primary) hypertension: Secondary | ICD-10-CM | POA: Diagnosis not present

## 2018-07-01 DIAGNOSIS — E7801 Familial hypercholesterolemia: Secondary | ICD-10-CM | POA: Diagnosis not present

## 2018-07-01 MED ORDER — ROSUVASTATIN CALCIUM 20 MG PO TABS: 20.0000 mg | ORAL_TABLET | Freq: Every day | ORAL | 3 refills | Status: DC

## 2018-07-01 NOTE — Telephone Encounter (Signed)
New Message:    Patient calling about a genic testing that Dr.hilty would like for her to have. Patient states that her primary care doctor would like a little more information. Please call patient.

## 2018-07-01 NOTE — Patient Instructions (Addendum)
Medication Instructions:  Start Crestor 20 mg daily after evening meal for cholesterol If you need a refill on your cardiac medications before your next appointment, please call your pharmacy.   Lab work: Fasting Lipid panel in 3 months If you have labs (blood work) drawn today and your tests are completely normal, you will receive your results only by: Marland Kitchen MyChart Message (if you have MyChart) OR . A paper copy in the mail If you have any lab test that is abnormal or we need to change your treatment, we will call you to review the results.  Testing/Procedures: None Ordered  Follow-Up: At [facility], you and your health needs are our priority.  As part of our continuing mission to provide you with exceptional heart care, we have created designated Provider Care Teams.  These Care Teams include your primary Cardiologist (physician) and Advanced Practice Providers (APPs -  Physician Assistants and Nurse Practitioners) who all work together to provide you with the care you need, when you need it. . Follow up with Dr.Hilty  Friday 10/03/18 at 10:15 am

## 2018-07-05 ENCOUNTER — Encounter: Payer: Self-pay | Admitting: Internal Medicine

## 2018-07-05 DIAGNOSIS — I10 Essential (primary) hypertension: Secondary | ICD-10-CM | POA: Insufficient documentation

## 2018-07-05 DIAGNOSIS — E7801 Familial hypercholesterolemia: Secondary | ICD-10-CM | POA: Insufficient documentation

## 2018-07-05 NOTE — Progress Notes (Signed)
LIPID CLINIC CONSULT NOTE  Chief Complaint:  Dyslipidemia  Primary Care Physician: System, Pcp Not In  Primary Cardiologist:  No primary care provider on file.  HPI:  Sheila Atkinson is a 63 y.o. female who is being seen today for the evaluation of dyslipidemia at the request of Bryson Corona, MD. 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  Surprisingly, she is not currently on any treatment.  Her most recent lipid profile December 2019 showed total cholesterol 309, triglycerides 181, HDL 60 and direct LDL of 241.  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  Additionally, there is heart disease in her family, mainly in her father's side including her father who was also in the [place] and died at age 37 of heart failure in her paternal grandfather who also died of a heart attack in his 22s.  His maternal grandfather also had a stroke and heart attack and her maternal grandmother also had heart attack.  Her mother also had bypass surgery at age 72 therefore she has significant cardiovascular risk factors from both parents on either side of the family.  It is difficult to tell however who she may have inherited the abnormal genetics which to find her FH.  She does have 2 sisters who have high cholesterol and one who may or may not.  She did have an episode of chest pain back in 2009.  [facility] in [place].  She had a stress echocardiogram which demonstrated ischemic EKG changes and antero-and anteroseptal wall motion abnormalities concerning for ischemia.  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  She then subsequently underwent 64  slice cardiac CT.  This demonstrated a total calcium score of 141 (in June 2009), a short left main that bifurcates in the LAD and circumflex and are free of disease.  The LAD which showed about a 30% mid LAD stenosis.  No significant circumflex and marginal branch disease.  The right coronary artery was noted to come off of the right coronary cusp however just adjacent to the left coronary cusp and is noted to take an intra-arterial course between the aorta and PA.  Overall, there is minimal coronary disease.  PMHx:  Past Medical History:  Diagnosis Date  . Anomaly    heart structure - ischemia  . Hyperlipidemia   . Hypertension     Past Surgical History:  Procedure Laterality Date  . CESAREAN SECTION  1995  . TONSILLECTOMY      FAMHx:  Family History  Problem Relation Age of Onset  . Heart disease Mother   . Diabetes Mother   . Kidney failure Mother   . Heart failure Father   . Hyperlipidemia Sister   . Hypertension Sister   . Hypertension Brother   . Hyperlipidemia Brother   . Heart attack Maternal Grandmother   . Heart attack Maternal Grandfather   . Stroke Maternal Grandfather   . Heart failure Paternal Grandmother   . Heart attack Paternal Grandfather   . Hyperlipidemia Sister   . Hypertension Sister   . Hyperlipidemia Sister     SOCHx:   reports that she has never smoked. She has never used smokeless tobacco. No  history on file for alcohol and drug.  ALLERGIES:  Allergies  Allergen Reactions  . Erythromycin Rash    ROS: Pertinent items noted in HPI and remainder of comprehensive ROS otherwise negative.  HOME MEDS: Current Outpatient Medications on File Prior to Visit  Medication Sig Dispense Refill  . aspirin EC 81 MG tablet Take 1 tablet by mouth daily.    . Cholecalciferol (VITAMIN D) 50 MCG (2000 UT) tablet Take 1 tablet by mouth daily.    Marland Kitchen lisinopril (PRINIVIL,ZESTRIL) 10 MG tablet Take 1 tablet by mouth daily.    . Turmeric 500 MG TABS Take 1  tablet by mouth daily.     No current facility-administered medications on file prior to visit.     LABS/IMAGING: No results found for this or any previous visit (from the past 48 hour(s)). No results found.  LIPID PANEL: No results found for: CHOL, TRIG, HDL, CHOLHDL, VLDL, LDLCALC, LDLDIRECT  WEIGHTS: Wt Readings from Last 3 Encounters:  07/01/18 207 lb 3.2 oz (94 kg)    VITALS: BP 118/82 (BP Location: Right Arm, Patient Position: Sitting, Cuff Size: Normal)   Pulse 76   Ht 5\' 10"  (1.778 m)   Wt 207 lb 3.2 oz (94 kg)   BMI 29.73 kg/m   EXAM: General appearance: alert and no distress Neck: no carotid bruit, no JVD and thyroid not enlarged, symmetric, no tenderness/mass/nodules Lungs: clear to auscultation bilaterally Heart: regular rate and rhythm, S1, S2 normal, no murmur, click, rub or gallop Abdomen: soft, non-tender; bowel sounds normal; no masses,  no organomegaly Extremities: extremities normal, atraumatic, no cyanosis or edema Pulses: 2+ and symmetric Skin: Skin color, texture, turgor normal. No rashes or lesions Neurologic: Grossly normal Psych: Pleasant  EKG: Normal sinus rhythm 76- personally reviewed  ASSESSMENT: 1. 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 2. 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 Strong family history of premature coronary disease 4. Hypertension  PLAN: 1.   Sheila Atkinson has significant dyslipidemia concerning for familial hyperlipidemia.  She is not currently on any statin therapy.  Previously she underwent left heart cath after an abnormal stress echocardiogram which showed mild nonobstructive coronary disease.  Because of a concerning anomalous right coronary artery she had a coronary artery CT scan which did show a high anterior and medial takeoff of the RCA that coursed between the aorta and PA.   This is now not thought to be a significant abnormality.  Moreover, she has significant dyslipidemia and likely familial hyperlipidemia, especially given the extent of coronary disease on both her mother and father side of the family.  I do think will be helpful for her to undergo genetic testing to try to determine the exact mechanism of her disease as well as the fact that it could be useful in screening her child who could be a candidate for early therapy.  I would recommend starting rosuvastatin 20 mg daily and will plan a repeat lipid profile in 3 months.  Her goal LDL is less than 70, and is likely she will also need a PCSK9 inhibitor to reach target.  Follow-up with me in 3 months.  Chrystie Nose, MD, [facility], FACP  [place]  [facility]  Medical Director of the Advanced Lipid Disorders &  Cardiovascular Risk Reduction Clinic Diplomate of the American Board of Clinical Lipidology Attending Cardiologist  Direct Dial: [phone]  Fax: [phone]  Website:  [email or web address] 07/05/2018, 10:01 PM

## 2018-07-16 ENCOUNTER — Telehealth: Payer: Self-pay | Admitting: Internal Medicine

## 2018-07-16 NOTE — Telephone Encounter (Signed)
Patient is calling because her insurance company needs information to get the genetic testing approved. Patient would like to know what will be done at appointment and what can be done to get preauth for this appointment. Appt is 08/19/18.

## 2018-07-16 NOTE — Telephone Encounter (Signed)
Patient states that she is needing authorization of why the genetic testing should be completed.  Patient states the insurance number is [id number] it is [facility] [employer], and that they faxed over information, we checked on the cart and did not see anything as of today.    I advised patient I would send this over to Dr.Hilty and his nurse to make them aware.

## 2018-07-17 NOTE — Telephone Encounter (Signed)
Thanks .Marland Kitchen I will review and complete PA as soon as I receive the request - I'm not in the office this week.  Dr. Rexene Edison

## 2018-09-18 ENCOUNTER — Ambulatory Visit: Admitting: Genetic Counselor

## 2018-09-18 ENCOUNTER — Other Ambulatory Visit: Payer: Self-pay

## 2018-09-26 ENCOUNTER — Telehealth: Payer: Self-pay | Admitting: Internal Medicine

## 2018-09-26 NOTE — Telephone Encounter (Signed)
LMVM-ok to come in fasting Tuesday d/t being closed Monday for holiday. Make sure to inquire with the lab tech and Dr Rennis Golden at appt.

## 2018-09-26 NOTE — Telephone Encounter (Signed)
New Message:    Patient calling concering her lab appt, patient forgot to fast. Her appt is next Wednesday. Her Question is if she come on Tuesday for labs will they be ready Wednesday for the doctor other wise should she cancel her appt. Please call patient back.

## 2018-09-26 NOTE — Progress Notes (Signed)
Pre-test GC notes   Due to the COVID-19 pandemic, patient consents to having a virtual genetic consult and for the visit details to be documented in her notes. Patient identity was confirmed using two unique identifiers of full name and date of birth   Referral Reason Sheila CablesMary Sheila Atkinson Atkinson was referred for genetic consult and testing of familial hypercholesterolemia (FH) by Dr. Rennis GoldenHilty  Genetic Consultation Notes  We walked through the risk factors that can lead to hypercholesterolemia. I explained to Sheila Atkinson the characteristic features of a genetic condition, namely absence of risk factors, early age of presentation, increased disease severity and family history of the condition. The clinical manifestations of FH were also reviewed.   I discussed the molecular pathogenesis of FH. I informed her that FH is primarily caused by pathogenic variants in three genes, namely APOB, LDLR and PCSK9. These pathogenic variants impact LDLR synthesis, degradation and recycling in cells leading to elevated LDL-C levels. We then walked through autosomal dominant inheritance pattern discussed incidence of heterozygous FH (HeFH) and homozygous FH (HoFH). I informed her that digenic or compound heterozygous mutations in APOB, LDLR and PCSK9 genes is also seen in HoFH.   We reviewed the likely outcomes of FH genetic testing. Based on the diagnostic criteria for FH, yields can range from 50%-90%. A positive yield is observed in  ~63% of patients with a definite clinical diagnosis of FH. I also made clear to her that a negative test does not exclude a genetic basis for FH. Polygenic inheritance where more than an average number of common variants with small effect increases plasma lipid levels as well as limitations in current genetic testing methodology can produce a negative result. Variants of unknown significance (VUS) can be seen in some cases. I explained to her that typically a VUS is so classified if the variant is not well  understood as very few individuals have been reported to harbor this variant or its role in gene function has not been elucidated. Screening other first-degree family members by genetic testing was also discussed. Additionally, we briefly touched upon the molecular basis of the different treatment modalities that are currently available.  Her medical and 4-generation family history was obtained. 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  She states that she was found to have elevated cholesterol when she retired from [employer] at age 63. She tells me that her prior lipid panel tests were normal. She was placed on Lipitor but suffered from muscle weakness of her left quadriceps and slurred speech. She stopped taking her medication for about 10 years. [place] she [facility] and was then referred to see Dr. Rennis GoldenHilty. Her most recent lipid panel shows total cholesterol of 309 and LDL-C of 241.  Risk Factors Sheila Atkinson denies having liver or kidney dysfunction. She does not have diabetes and does not smoke or partake of an atherogenic diet. She was found to have hypertension in 2018 and has thyroid issues.   Family history Sheila Atkinson (III.3) is the third child of five children. She has a healthy 148 year old son (IV.6) but is unsure if has had lipids panel testing. Her eldest sister (III.1) was diagnosed with hypercholesterolemia at 63 and is being treated with Crestor. Her older brother (III.2) and younger sister (III.4) were also diagnosed with hypercholesterolemia at 63 and are taking medication. Her youngest sister (III.5) apparently has  normal lipids. Neither Sheila Atkinson nor her siblings have had an adverse event due to their hypercholesterolemia.  Atkinson father (II.6) suffered a mild heart attack when he was visiting his parents in [place]. He died suddenly at age 30 from a suspected  heart attack when he was helping a doctor move his bed to his new home. He collapsed while dragging the bed and could not be revived. Sheila Atkinson is not sure if he had elevated lipids as he never went to the doctor. He also retired from [employer] at age 44. She says that he owned a Interior and spatial designer and worked very hard.   Two of her fathers siblings (II.2, II.4) died of a heart attack in their 84s as did her paternal grandfather (I.1) who died at age 14. Her paternal grandmother (I.2) died of a stroke in her mid to late 85s.  Atkinson mother (II.7) was overweight all her life. She was diagnosed with hypercholesterolemia in her 63s. Sheila Atkinson and may have been closer to 400s. Atkinson mother had tachycardia and was taken to the hospital at age 63. She was found to have >75% blockage in 3 arteries and underwent a triple bypass. She had hypertenision, type 2 diabetes that led to kidney failure and was the cause of her death.  Atkinson siblings (II.8-II.10) died of cancer and did not have elevated cholesterol as far as she knows. Her maternal grandfather (I.3) had 5 strokes and more than 1 heart attack. He died of a cerebral hemorrhage at age 74. Her maternal grandmother (I.4) had a similar medical history as her Atkinson mother. She too had elevated lipids and underwent a bypass at 65. She died of a heart attack at age 37.    Impression and Plans  In summary, Sheila Atkinson was diagnosed with hypercholesterolemia at a young age in the absence of the typical risk factors for hyperlipidemia. She reports sudden death in her father in the absence of a known underlying condition. In addition, she reports three of her siblings, her mother and maternal grandmother with hypercholesterolemia. While she and her siblings have not had an adverse event due to their elevated lipids, her mother and maternal grandmother have undergone a cardiac bypass in their 41s. It is highly likely that she may have  inherited her condition from her mother. In light of her presentation and family history of hypercholesterolemia and sudden death in her father, genetic testing is highly recommended. Genetic testing should evaluate the major genes implicated in familial hypercholesterolemia. Since this is an autosomal dominant condition, her son are at a 50% risk of inheriting it. If she tests positive, her son can be tested later to determine if he has inherited the familial pathogenic variant. Sheila Atkinson verbalized understanding of this.   In addition, we discussed the protections afforded by the Genetic Information Non-Discrimination Act (GINA). I explained to her that GINA protects one from losing employment or health insurance based on their genotype. However, these protections do not cover life insurance and disability. She verbalized understanding of this.  Please note that the patient has not been counseled in this visit on personal, cultural or ethical issues that he may face due to his heart condition.   Plans Cherida Shemwell is interested in genetic testing. She is currently waiting authorization from her insurance company before she proceeds with testing    Sidney Ace, Ph.D, [facility] Clinical Molecular Geneticist

## 2018-09-30 ENCOUNTER — Telehealth: Payer: Self-pay | Admitting: Internal Medicine

## 2018-09-30 LAB — LIPID PANEL
Chol/HDL Ratio: 2.8 ratio (ref 0.0–4.4)
Cholesterol, Total: 149 mg/dL (ref 100–199)
HDL: 53 mg/dL (ref >39)
LDL Calculated: 81 mg/dL (ref 0–99)
Triglycerides: 75 mg/dL (ref 0–149)
VLDL Cholesterol Cal: 15 mg/dL (ref 5–40)

## 2018-09-30 NOTE — Telephone Encounter (Signed)
Smart phone/pt wants to use computer/my chart pre reg complete/consent obtained/ttf

## 2018-10-01 ENCOUNTER — Encounter: Payer: Self-pay | Admitting: Internal Medicine

## 2018-10-01 ENCOUNTER — Telehealth (INDEPENDENT_AMBULATORY_CARE_PROVIDER_SITE_OTHER): Admitting: Internal Medicine

## 2018-10-01 VITALS — BP 138/88 | HR 76 | Ht 70.0 in | Wt 190.0 lb

## 2018-10-01 DIAGNOSIS — E7801 Familial hypercholesterolemia: Secondary | ICD-10-CM

## 2018-10-01 DIAGNOSIS — I10 Essential (primary) hypertension: Secondary | ICD-10-CM

## 2018-10-01 NOTE — Patient Instructions (Signed)
Medication Instructions:  Continue current medications If you need a refill on your cardiac medications before your next appointment, please call your pharmacy.   Lab work: FASTING lab work in 6 months prior to next visit with Dr. Rennis Golden If you have labs (blood work) drawn today and your tests are completely normal, you will receive your results only by: Marland Kitchen MyChart Message (if you have MyChart) OR . A paper copy in the mail If you have any lab test that is abnormal or we need to change your treatment, we will call you to review the results.  Follow-Up: At [facility], you and your health needs are our priority.  As part of our continuing mission to provide you with exceptional heart care, we have created designated Provider Care Teams.  These Care Teams include your primary Cardiologist (physician) and Advanced Practice Providers (APPs -  Physician Assistants and Nurse Practitioners) who all work together to provide you with the care you need, when you need it. You will need a follow up appointment in 6 months - LIPID CLINIC.  Please call our office 2 months in advance to schedule this appointment.

## 2018-10-02 NOTE — Progress Notes (Signed)
Virtual Visit via Video Note   This visit type was conducted due to national recommendations for restrictions regarding the COVID-19 Pandemic (e.g. social distancing) in an effort to limit this patient's exposure and mitigate transmission in our community.  Due to her co-morbid illnesses, this patient is at least at moderate risk for complications without adequate follow up.  This format is felt to be most appropriate for this patient at this time.  All issues noted in this document were discussed and addressed.  A limited physical exam was performed with this format.  Please refer to the patient's chart for her consent to telehealth for [facility].   Evaluation Performed: Doximity video visit  Date:  10/02/2018   ID:  Sheila Atkinson, DOB 30-May-1955, MRN [id number]  Patient Location:  [address] [place] [address]  Provider location:   [address], [address] [place], [place] [address]  PCP:  System, Pcp Not In  Cardiologist:  No primary care provider on file. Electrophysiologist:  None   Chief Complaint: Follow-up dyslipidemia  History of Present Illness:    Sheila Atkinson is a 63 y.o. female who presents via audio/video conferencing for a telehealth visit today.  Sheila Atkinson was seen today for follow-up of dyslipidemia.  Overall she is doing well and seems to be tolerating her rosuvastatin.  Recent lab work showed total cholesterol 149, triglycerides 75, HDL 53 and LDL 81.  She also mentioned that she is lost a significant amount of weight and has been exercising and eating more healthy.  She is quite close to target LDL less than 70 without having needed to add a PCSK9 inhibitor.  She is also undergone genetic screening by Dr. Jomarie LongsJoseph.  And was that she feels there is a good chance that she has a genetic familial hyperlipidemia.  Need to get prior authorization from [employer] in order to proceed with testing which be beneficial for has affected offspring.  The patient  does not have symptoms concerning for COVID-19 infection (fever, chills, cough, or new SHORTNESS OF BREATH).    Prior CV studies:   The following studies were reviewed today:  Chart review  PMHx:  Past Medical History:  Diagnosis Date  . Anomaly    heart structure - ischemia  . Hyperlipidemia   . Hypertension     Past Surgical History:  Procedure Laterality Date  . CESAREAN SECTION  1995  . TONSILLECTOMY      FAMHx:  Family History  Problem Relation Age of Onset  . Heart disease Mother   . Diabetes Mother   . Kidney failure Mother   . Heart failure Father   . Hyperlipidemia Sister   . Hypertension Sister   . Hypertension Brother   . Hyperlipidemia Brother   . Heart attack Maternal Grandmother   . Heart attack Maternal Grandfather   . Stroke Maternal Grandfather   . Heart failure Paternal Grandmother   . Heart attack Paternal Grandfather   . Hyperlipidemia Sister   . Hypertension Sister   . Hyperlipidemia Sister     SOCHx:   reports that she has never smoked. She has never used smokeless tobacco. No history on file for alcohol and drug.  ALLERGIES:  Allergies  Allergen Reactions  . Erythromycin Rash    MEDS:  Current Meds  Medication Sig  . aspirin EC 81 MG tablet Take 1 tablet by mouth daily.  . Cholecalciferol (VITAMIN D) 50 MCG (2000 UT) tablet Take 1 tablet by mouth  daily.  . lisinopril (PRINIVIL,ZESTRIL) 10 MG tablet Take 1 tablet by mouth daily.  . rosuvastatin (CRESTOR) 20 MG tablet Take 1 tablet (20 mg total) by mouth daily.  . Turmeric 500 MG TABS Take 1 tablet by mouth as directed.      ROS: Pertinent items noted in HPI and remainder of comprehensive ROS otherwise negative.  Labs/Other Tests and Data Reviewed:    Recent Labs: No results found for requested labs within last 8760 hours.   Recent Lipid Panel Lab Results  Component Value Date/Time   CHOL 149 09/30/2018 08:31 AM   TRIG 75 09/30/2018 08:31 AM   HDL 53 09/30/2018 08:31 AM    CHOLHDL 2.8 09/30/2018 08:31 AM   LDLCALC 81 09/30/2018 08:31 AM    Wt Readings from Last 3 Encounters:  10/01/18 190 lb (86.2 kg)  07/01/18 207 lb 3.2 oz (94 kg)     Exam:    Vital Signs:  BP 138/88   Pulse 76   Ht 5\' 10"  (1.778 m)   Wt 190 lb (86.2 kg)   BMI 27.26 kg/m    General appearance: alert and no distress Lungs: No visual respiratory difficulty Abdomen: Mildly overweight Extremities: extremities normal, atraumatic, no cyanosis or edema Skin: Skin color, texture, turgor normal. No rashes or lesions Neurologic: Mental status: Alert, oriented, thought content appropriate Psych: Pleasant  ASSESSMENT & PLAN:    1. 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 2. 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 Strong family history of premature coronary disease 4. Hypertension  Sheila Atkinson has had significant improvement in her lipid profile on rosuvastatin but more importantly with dietary changes and weight loss.  She is near goal LDL less than 70 and I suspect may not need a PCSK9 inhibitor.  She wants to additionally work on exercise, diet and weight loss.  We will plan a repeat lipid profile in 6 months.  If she is not quite to target, we could consider adding ezetimibe which will very likely get her to goal.  Overall I am pleased with her progress and commended her on her efforts.  COVID-19 Education: The signs and symptoms of COVID-19 were discussed with the patient and how to seek care for testing (follow up with PCP or arrange E-visit).  The importance of social distancing was discussed today.  Patient Risk:   After full review of this patients clinical status, I feel that they are at least moderate risk at this time.  Time:   Today, I have spent 15 minutes with the patient with telehealth technology discussing weight loss, lipid  management, genetic testing.     Medication Adjustments/Labs and Tests Ordered: Current medicines are reviewed at length with the patient today.  Concerns regarding medicines are outlined above.   Tests Ordered: No orders of the defined types were placed in this encounter.   Medication Changes: No orders of the defined types were placed in this encounter.   Disposition:  in 6 month(s)  Chrystie Nose, MD, [facility], FACP  [place]  [facility]  Medical Director of the Advanced Lipid Disorders &  Cardiovascular Risk Reduction Clinic Diplomate of the American Board of Clinical Lipidology Attending Cardiologist  Direct Dial: [phone]  Fax: [phone]  Website:  [email or web address]  Chrystie Nose, MD  10/02/2018 9:48 AM

## 2018-10-03 ENCOUNTER — Ambulatory Visit: Payer: Self-pay | Admitting: Internal Medicine

## 2018-11-04 NOTE — Telephone Encounter (Signed)
Opened in error

## 2018-11-28 ENCOUNTER — Other Ambulatory Visit: Payer: Self-pay | Admitting: Internal Medicine

## 2018-11-28 DIAGNOSIS — E7801 Familial hypercholesterolemia: Secondary | ICD-10-CM

## 2019-03-09 ENCOUNTER — Other Ambulatory Visit: Payer: Self-pay | Admitting: Internal Medicine

## 2019-03-09 DIAGNOSIS — E7801 Familial hypercholesterolemia: Secondary | ICD-10-CM

## 2019-03-19 ENCOUNTER — Ambulatory Visit: Admitting: Genetic Counselor

## 2019-03-19 ENCOUNTER — Other Ambulatory Visit: Payer: Self-pay

## 2019-03-28 NOTE — Progress Notes (Signed)
Post Test Genetic Consult  Referral Reason  Sheila Atkinson" [name] underwent genetic testing for Familial Hypercholesterolemia ([place]). She is here today for her post-test genetic consult.  We initially reviewed her family pedigree. She notes no changes to her medical status. She does relate that her elder sister's son was killed in a road accident.   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 I told her that mutations in APOB gene are detected in 1%-5% of mutations in [place] cases. A patient with an APOB gene mutation typically presents with a milder phenotype than FH patients with LDLR gene mutation. She acknowledged this.  I explained to her that the APOB S3503L variant has the molecular characteristics of a mutation. However, as this variant has not yet been reported in literature and functional assays have not been performed to confirm its pathogenicity, the APOB S3503L variant has been called out as a VUS. Nevertheless, FH patients with APOB mutations have better response to statins and lower risk of coronary heart disease, as evidenced in her family.  Since there is significant history of hypercholesterolemia amongst her siblings, diagnosed at a young age, they can undergo testing to confirm if this variant segregates with disease. If it does, then it is likely pathogenic and her son can be tested to determine his status. She then informs me that her youngest sister does not have health care coverage and also has elevated LDL-C. She is not sure when this diagnosis was made. In light of this, it may be easier for her son to undergo routine lipid monitoring and seek treatment when his LDL-C is elevated. She is agreeable with this plan of action.  Please note that the patient has not been counseled in this visit on personal, cultural or ethical issues that he may face due to his  heart condition.    Lattie Corns, Ph.D, [facility] Clinical Molecular Geneticist

## 2019-05-05 ENCOUNTER — Other Ambulatory Visit: Payer: Self-pay

## 2019-05-05 DIAGNOSIS — E7801 Familial hypercholesterolemia: Secondary | ICD-10-CM

## 2019-05-05 LAB — LIPID PANEL
Chol/HDL Ratio: 3.2 ratio (ref 0.0–4.4)
Cholesterol, Total: 183 mg/dL (ref 100–199)
HDL: 58 mg/dL (ref >39)
LDL Chol Calc (NIH): 101 mg/dL — ABNORMAL HIGH (ref 0–99)
Triglycerides: 137 mg/dL (ref 0–149)
VLDL Cholesterol Cal: 24 mg/dL (ref 5–40)

## 2019-05-13 ENCOUNTER — Other Ambulatory Visit: Payer: Self-pay

## 2019-05-13 ENCOUNTER — Encounter: Payer: Self-pay | Admitting: Internal Medicine

## 2019-05-13 ENCOUNTER — Ambulatory Visit (INDEPENDENT_AMBULATORY_CARE_PROVIDER_SITE_OTHER): Admitting: Internal Medicine

## 2019-05-13 VITALS — BP 144/76 | HR 88 | Ht 70.0 in | Wt 196.9 lb

## 2019-05-13 DIAGNOSIS — I10 Essential (primary) hypertension: Secondary | ICD-10-CM | POA: Diagnosis not present

## 2019-05-13 DIAGNOSIS — E7801 Familial hypercholesterolemia: Secondary | ICD-10-CM | POA: Diagnosis not present

## 2019-05-13 DIAGNOSIS — I2510 Atherosclerotic heart disease of native coronary artery without angina pectoris: Secondary | ICD-10-CM | POA: Diagnosis not present

## 2019-05-13 MED ORDER — LISINOPRIL 10 MG PO TABS: 10.0000 mg | ORAL_TABLET | Freq: Every day | ORAL | 3 refills | Status: DC

## 2019-05-13 NOTE — Progress Notes (Signed)
LIPID CLINIC CONSULT NOTE  Chief Complaint:  Dyslipidemia  Primary Care Physician: System, Pcp Not In  Primary Cardiologist:  No primary care provider on file.  HPI:  Sheila Atkinson is a 64 y.o. female who is being seen today for the evaluation of dyslipidemia at the request of No ref. provider found. 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  Surprisingly, she is not currently on any treatment.  Her most recent lipid profile December 2019 showed total cholesterol 309, triglycerides 181, HDL 60 and direct LDL of 241.  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  Additionally, there is heart disease in her family, mainly in her father's side including her father who was also in the [place] and died at age 64 of heart failure in her paternal grandfather who also died of a heart attack in his 66s.  His maternal grandfather also had a stroke and heart attack and her maternal grandmother also had heart attack.  Her mother also had bypass surgery at age 46 therefore she has significant cardiovascular risk factors from both parents on either side of the family.  It is difficult to tell however who she may have inherited the abnormal genetics which to find her FH.  She does have 2 sisters who have high cholesterol and one who may or may not.  She did have an episode of chest pain back in 2009.  [facility] in [place].  She had a stress echocardiogram which demonstrated ischemic EKG changes and antero-and anteroseptal wall motion abnormalities concerning for ischemia.  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  She then subsequently underwent 64  slice cardiac CT.  This demonstrated a total calcium score of 141 (in June 2009), a short left main that bifurcates in the LAD and circumflex and are free of disease.  The LAD which showed about a 30% mid LAD stenosis.  No significant circumflex and marginal branch disease.  The right coronary artery was noted to come off of the right coronary cusp however just adjacent to the left coronary cusp and is noted to take an intra-arterial course between the aorta and PA.  Overall, there is minimal coronary disease.  05/13/2019  Ms. [name] returns today for follow-up.  Overall she is doing quite well.  Although she had marked dyslipidemia with total cholesterol of 309 and direct LDL of 241, she was placed on high potency rosuvastatin 20 mg daily and had a marked improvement in her dyslipidemia.  In fact she was a "super responder".  Her total cholesterol is now 154, HDL 66, LDL 63 and triglycerides 126.  She did undergo genetic testing with Dr. 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 The only reason it is called out as VUS is that it has not yet been reported in [facility] patients in literature. So far only 3 clinical labs have seen this variant in their [place] testing population.  As per other APO B mutations, we have seen a marked response to statins and lower overall family history of pathologic disease.  PMHx:  Past Medical History:  Diagnosis Date  . Anomaly  heart structure - ischemia  . Hyperlipidemia   . Hypertension     Past Surgical History:  Procedure Laterality Date  . CESAREAN SECTION  1995  . TONSILLECTOMY      FAMHx:  Family History  Problem Relation Age of Onset  . Heart disease Mother   . Diabetes Mother   . Kidney failure Mother   . Heart failure Father   . Hyperlipidemia Sister   . Hypertension Sister   . Hypertension Brother   . Hyperlipidemia Brother   . Heart attack Maternal Grandmother   . Heart  attack Maternal Grandfather   . Stroke Maternal Grandfather   . Heart failure Paternal Grandmother   . Heart attack Paternal Grandfather   . Hyperlipidemia Sister   . Hypertension Sister   . Hyperlipidemia Sister     SOCHx:   reports that she has never smoked. She has never used smokeless tobacco. No history on file for alcohol and drug.  ALLERGIES:  Allergies  Allergen Reactions  . Erythromycin Rash    ROS: Pertinent items noted in HPI and remainder of comprehensive ROS otherwise negative.  HOME MEDS: Current Outpatient Medications on File Prior to Visit  Medication Sig Dispense Refill  . aspirin EC 81 MG tablet Take 1 tablet by mouth daily.    . Cholecalciferol (VITAMIN D) 50 MCG (2000 UT) tablet Take 1 tablet by mouth daily.    . rosuvastatin (CRESTOR) 20 MG tablet Take 1 tablet (20 mg total) by mouth daily. 90 tablet 3   No current facility-administered medications on file prior to visit.    LABS/IMAGING: No results found for this or any previous visit (from the past 48 hour(s)). No results found.  LIPID PANEL:    Component Value Date/Time   CHOL 183 05/05/2019 0837   TRIG 137 05/05/2019 0837   HDL 58 05/05/2019 0837   CHOLHDL 3.2 05/05/2019 0837   LDLCALC 101 (H) 05/05/2019 0837    WEIGHTS: Wt Readings from Last 3 Encounters:  05/13/19 196 lb 14.1 oz (89.3 kg)  10/01/18 190 lb (86.2 kg)  07/01/18 207 lb 3.2 oz (94 kg)    VITALS: BP (!) 144/76   Pulse 88   Ht 5\' 10"  (1.778 m)   Wt 196 lb 14.1 oz (89.3 kg)   SpO2 99%   BMI 28.25 kg/m   EXAM: General appearance: alert and no distress Neck: no carotid bruit, no JVD and thyroid not enlarged, symmetric, no tenderness/mass/nodules Lungs: clear to auscultation bilaterally Heart: regular rate and rhythm, S1, S2 normal, no murmur, click, rub or gallop Abdomen: soft, non-tender; bowel sounds normal; no masses,  no organomegaly Extremities: extremities normal, atraumatic, no cyanosis or edema Pulses: 2+  and symmetric Skin: Skin color, texture, turgor normal. No rashes or lesions Neurologic: Grossly normal Psych: Pleasant  EKG: Deferred  ASSESSMENT: 1. 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 2. 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 3. Strong family history of premature coronary disease 4. Hypertension 5. Statin "hyperresponder"  PLAN: 1.   Mrs. Schwalbe was found to have a variant of unknown significance in ApoB which is likely associated with a lower risk of coronary disease although considered pathologic.  She was found to have minimal coronary disease by cath in 2009.  She is a statin hyper responder with marked reduction in LDL cholesterol.  This is typical also of APOB mutations.  I would encourage continued use of rosuvastatin, will refill her lisinopril today and continue on low-dose aspirin for known coronary disease.  Follow-up with me annually or sooner as necessary.  Chrystie Nose, MD, [facility], FACP  [place]  [facility]  Medical Director of the Advanced Lipid Disorders &  Cardiovascular Risk Reduction Clinic Diplomate of the American Board of Clinical Lipidology Attending Cardiologist  Direct Dial: [phone]  Fax: [phone]  Website:  [email or web address] 05/13/2019, 9:23 AM

## 2019-05-13 NOTE — Patient Instructions (Addendum)
Medication Instructions:  NO CHANGE *If you need a refill on your cardiac medications before your next appointment, please call your pharmacy*  Lab Work: If you have labs (blood work) drawn today and your tests are completely normal, you will receive your results only by: Marland Kitchen MyChart Message (if you have MyChart) OR . A paper copy in the mail If you have any lab test that is abnormal or we need to change your treatment, we will call you to review the results.  Follow-Up: At [facility], you and your health needs are our priority.  As part of our continuing mission to provide you with exceptional heart care, we have created designated Provider Care Teams.  These Care Teams include your primary Cardiologist (physician) and Advanced Practice Providers (APPs -  Physician Assistants and Nurse Practitioners) who all work together to provide you with the care you need, when you need it.  Your next appointment:   12 month(s)  The format for your next appointment:   Either In Person or Virtual  Provider:   You may see DR HILTY or one of the following Advanced Practice Providers on your designated Care Team:    Azalee Course, PA-C  Micah Flesher, PA-C or   Judy Pimple, [place]

## 2019-05-14 MED ORDER — ROSUVASTATIN CALCIUM 20 MG PO TABS: 20.0000 mg | ORAL_TABLET | Freq: Every day | ORAL | 3 refills | Status: DC

## 2019-06-18 ENCOUNTER — Other Ambulatory Visit: Payer: Self-pay | Admitting: Internal Medicine

## 2019-08-09 ENCOUNTER — Other Ambulatory Visit: Payer: Self-pay

## 2019-08-09 ENCOUNTER — Emergency Department (HOSPITAL_BASED_OUTPATIENT_CLINIC_OR_DEPARTMENT_OTHER)

## 2019-08-09 ENCOUNTER — Encounter (HOSPITAL_BASED_OUTPATIENT_CLINIC_OR_DEPARTMENT_OTHER): Payer: Self-pay | Admitting: Emergency Medicine

## 2019-08-09 ENCOUNTER — Inpatient Hospital Stay (HOSPITAL_COMMUNITY)

## 2019-08-09 ENCOUNTER — Inpatient Hospital Stay (HOSPITAL_BASED_OUTPATIENT_CLINIC_OR_DEPARTMENT_OTHER)
Admission: EM | Admit: 2019-08-09 | Discharge: 2019-08-11 | DRG: 418 | Disposition: A | Discharge status: Home / Self Care | Attending: Internal Medicine | Admitting: Internal Medicine

## 2019-08-09 DIAGNOSIS — K8000 Calculus of gallbladder with acute cholecystitis without obstruction: Secondary | ICD-10-CM | POA: Diagnosis present

## 2019-08-09 DIAGNOSIS — Z20822 Contact with and (suspected) exposure to covid-19: Secondary | ICD-10-CM | POA: Diagnosis present

## 2019-08-09 DIAGNOSIS — R7989 Other specified abnormal findings of blood chemistry: Secondary | ICD-10-CM

## 2019-08-09 DIAGNOSIS — R1010 Upper abdominal pain, unspecified: Secondary | ICD-10-CM

## 2019-08-09 DIAGNOSIS — K8050 Calculus of bile duct without cholangitis or cholecystitis without obstruction: Secondary | ICD-10-CM | POA: Diagnosis not present

## 2019-08-09 DIAGNOSIS — R109 Unspecified abdominal pain: Secondary | ICD-10-CM

## 2019-08-09 DIAGNOSIS — Z79899 Other long term (current) drug therapy: Secondary | ICD-10-CM | POA: Diagnosis not present

## 2019-08-09 DIAGNOSIS — E7849 Other hyperlipidemia: Secondary | ICD-10-CM | POA: Diagnosis present

## 2019-08-09 DIAGNOSIS — K8020 Calculus of gallbladder without cholecystitis without obstruction: Secondary | ICD-10-CM | POA: Diagnosis present

## 2019-08-09 DIAGNOSIS — Z8249 Family history of ischemic heart disease and other diseases of the circulatory system: Secondary | ICD-10-CM

## 2019-08-09 DIAGNOSIS — Z83438 Family history of other disorder of lipoprotein metabolism and other lipidemia: Secondary | ICD-10-CM

## 2019-08-09 DIAGNOSIS — R079 Chest pain, unspecified: Secondary | ICD-10-CM | POA: Diagnosis not present

## 2019-08-09 DIAGNOSIS — K838 Other specified diseases of biliary tract: Secondary | ICD-10-CM

## 2019-08-09 DIAGNOSIS — Z7982 Long term (current) use of aspirin: Secondary | ICD-10-CM | POA: Diagnosis not present

## 2019-08-09 DIAGNOSIS — N179 Acute kidney failure, unspecified: Secondary | ICD-10-CM | POA: Diagnosis present

## 2019-08-09 DIAGNOSIS — E869 Volume depletion, unspecified: Secondary | ICD-10-CM | POA: Diagnosis present

## 2019-08-09 DIAGNOSIS — I10 Essential (primary) hypertension: Secondary | ICD-10-CM | POA: Diagnosis present

## 2019-08-09 DIAGNOSIS — Z881 Allergy status to other antibiotic agents status: Secondary | ICD-10-CM | POA: Diagnosis not present

## 2019-08-09 LAB — URINALYSIS, ROUTINE W REFLEX MICROSCOPIC
Bilirubin Urine: NEGATIVE
Glucose, UA: NEGATIVE mg/dL
Hgb urine dipstick: NEGATIVE
Ketones, ur: NEGATIVE mg/dL
Nitrite: NEGATIVE
Protein, ur: NEGATIVE mg/dL
Specific Gravity, Urine: 1.01 (ref 1.005–1.030)
pH: 6.5 (ref 5.0–8.0)

## 2019-08-09 LAB — HEPATITIS B CORE ANTIBODY, IGM: Hep B C IgM: NONREACTIVE

## 2019-08-09 LAB — URINALYSIS, MICROSCOPIC (REFLEX)

## 2019-08-09 LAB — CBC WITH DIFFERENTIAL/PLATELET
Abs Immature Granulocytes: 0.05 10*3/uL (ref 0.00–0.07)
Basophils Absolute: 0 10*3/uL (ref 0.0–0.1)
Basophils Relative: 0 %
Eosinophils Absolute: 0.1 10*3/uL (ref 0.0–0.5)
Eosinophils Relative: 1 %
HCT: 40.8 % (ref 36.0–46.0)
Hemoglobin: 13.2 g/dL (ref 12.0–15.0)
Immature Granulocytes: 1 %
Lymphocytes Relative: 19 %
Lymphs Abs: 1.9 10*3/uL (ref 0.7–4.0)
MCH: 29.3 pg (ref 26.0–34.0)
MCHC: 32.4 g/dL (ref 30.0–36.0)
MCV: 90.7 fL (ref 80.0–100.0)
Monocytes Absolute: 0.6 10*3/uL (ref 0.1–1.0)
Monocytes Relative: 6 %
Neutro Abs: 7.4 10*3/uL (ref 1.7–7.7)
Neutrophils Relative %: 73 %
Platelets: 202 10*3/uL (ref 150–400)
RBC: 4.5 MIL/uL (ref 3.87–5.11)
RDW: 13.1 % (ref 11.5–15.5)
WBC: 10.1 10*3/uL (ref 4.0–10.5)
nRBC: 0 % (ref 0.0–0.2)

## 2019-08-09 LAB — COMPREHENSIVE METABOLIC PANEL
ALT: 169 U/L — ABNORMAL HIGH (ref 0–44)
AST: 223 U/L — ABNORMAL HIGH (ref 15–41)
Albumin: 4.1 g/dL (ref 3.5–5.0)
Alkaline Phosphatase: 84 U/L (ref 38–126)
Anion gap: 11 (ref 5–15)
BUN: 23 mg/dL (ref 8–23)
CO2: 26 mmol/L (ref 22–32)
Calcium: 9.2 mg/dL (ref 8.9–10.3)
Chloride: 101 mmol/L (ref 98–111)
Creatinine, Ser: 1.16 mg/dL — ABNORMAL HIGH (ref 0.44–1.00)
GFR calc Af Amer: 58 mL/min — ABNORMAL LOW (ref >60)
GFR calc non Af Amer: 50 mL/min — ABNORMAL LOW (ref >60)
Glucose, Bld: 106 mg/dL — ABNORMAL HIGH (ref 70–99)
Potassium: 3.9 mmol/L (ref 3.5–5.1)
Sodium: 138 mmol/L (ref 135–145)
Total Bilirubin: 0.4 mg/dL (ref 0.3–1.2)
Total Protein: 7 g/dL (ref 6.5–8.1)

## 2019-08-09 LAB — HEPATITIS B SURFACE ANTIBODY,QUALITATIVE: Hep B S Ab: NONREACTIVE

## 2019-08-09 LAB — CBC
HCT: 37.9 % (ref 36.0–46.0)
Hemoglobin: 12.3 g/dL (ref 12.0–15.0)
MCH: 29.5 pg (ref 26.0–34.0)
MCHC: 32.5 g/dL (ref 30.0–36.0)
MCV: 90.9 fL (ref 80.0–100.0)
Platelets: 187 10*3/uL (ref 150–400)
RBC: 4.17 MIL/uL (ref 3.87–5.11)
RDW: 13 % (ref 11.5–15.5)
WBC: 5.3 10*3/uL (ref 4.0–10.5)
nRBC: 0 % (ref 0.0–0.2)

## 2019-08-09 LAB — LIPASE, BLOOD: Lipase: 41 U/L (ref 11–51)

## 2019-08-09 LAB — TROPONIN I (HIGH SENSITIVITY)
Troponin I (High Sensitivity): 3 ng/L (ref <18)
Troponin I (High Sensitivity): 4 ng/L (ref <18)
Troponin I (High Sensitivity): 4 ng/L (ref <18)

## 2019-08-09 LAB — CREATININE, SERUM
Creatinine, Ser: 0.84 mg/dL (ref 0.44–1.00)
GFR calc Af Amer: >60 mL/min (ref >60)
GFR calc non Af Amer: >60 mL/min (ref >60)

## 2019-08-09 LAB — HIV ANTIBODY (ROUTINE TESTING W REFLEX): HIV Screen 4th Generation wRfx: NONREACTIVE

## 2019-08-09 LAB — HEPATITIS B SURFACE ANTIGEN: Hepatitis B Surface Ag: NONREACTIVE

## 2019-08-09 LAB — SURGICAL PCR SCREEN
MRSA, PCR: NEGATIVE
Staphylococcus aureus: NEGATIVE

## 2019-08-09 LAB — SARS CORONAVIRUS 2 (TAT 6-24 HRS): SARS Coronavirus 2: NEGATIVE

## 2019-08-09 LAB — HEPATITIS C ANTIBODY: HCV Ab: NONREACTIVE

## 2019-08-09 MED ORDER — SODIUM CHLORIDE 0.9 % IV SOLN: INTRAVENOUS | Status: DC

## 2019-08-09 MED ORDER — HEPARIN SODIUM (PORCINE) 5000 UNIT/ML IJ SOLN
5000.0000 [IU] | Freq: Three times a day (TID) | INTRAMUSCULAR | Status: DC
  Filled 2019-08-09 (×2): qty 1

## 2019-08-09 MED ORDER — ROSUVASTATIN CALCIUM 20 MG PO TABS
20.0000 mg | ORAL_TABLET | Freq: Every day | ORAL | Status: DC
  Filled 2019-08-09: qty 1

## 2019-08-09 MED ORDER — ONDANSETRON HCL 4 MG/2ML IJ SOLN
4.0000 mg | INTRAMUSCULAR | Status: DC | PRN
  Administered 2019-08-09: 4 mg via INTRAVENOUS
  Filled 2019-08-09: qty 2

## 2019-08-09 MED ORDER — PANTOPRAZOLE SODIUM 40 MG IV SOLR
40.0000 mg | INTRAVENOUS | Status: DC
  Administered 2019-08-10 – 2019-08-11 (×2): 40 mg via INTRAVENOUS
  Filled 2019-08-09 (×2): qty 40

## 2019-08-09 MED ORDER — LIDOCAINE VISCOUS HCL 2 % MT SOLN
15.0000 mL | Freq: Once | OROMUCOSAL | Status: AC
  Administered 2019-08-09: 15 mL via ORAL
  Filled 2019-08-09: qty 15

## 2019-08-09 MED ORDER — ACETAMINOPHEN 325 MG PO TABS
650.0000 mg | ORAL_TABLET | Freq: Four times a day (QID) | ORAL | Status: DC | PRN
  Administered 2019-08-09 – 2019-08-11 (×4): 650 mg via ORAL
  Filled 2019-08-09 (×4): qty 2

## 2019-08-09 MED ORDER — ASPIRIN EC 81 MG PO TBEC: 81.0000 mg | DELAYED_RELEASE_TABLET | Freq: Every day | ORAL | Status: DC

## 2019-08-09 MED ORDER — ALUM & MAG HYDROXIDE-SIMETH 200-200-20 MG/5ML PO SUSP
30.0000 mL | Freq: Once | ORAL | Status: AC
  Administered 2019-08-09: 30 mL via ORAL
  Filled 2019-08-09: qty 30

## 2019-08-09 MED ORDER — PANTOPRAZOLE SODIUM 40 MG IV SOLR
40.0000 mg | Freq: Once | INTRAVENOUS | Status: AC
  Administered 2019-08-09: 40 mg via INTRAVENOUS
  Filled 2019-08-09: qty 40

## 2019-08-09 MED ORDER — IOHEXOL 300 MG/ML  SOLN
100.0000 mL | Freq: Once | INTRAMUSCULAR | Status: AC | PRN
  Administered 2019-08-09: 100 mL via INTRAVENOUS

## 2019-08-09 MED ORDER — MUPIROCIN 2 % EX OINT
1.0000 "application " | TOPICAL_OINTMENT | Freq: Two times a day (BID) | CUTANEOUS | Status: DC
  Administered 2019-08-09 – 2019-08-10 (×2): 1 via NASAL
  Filled 2019-08-09: qty 22

## 2019-08-09 MED ORDER — PIPERACILLIN-TAZOBACTAM 3.375 G IVPB
3.3750 g | Freq: Three times a day (TID) | INTRAVENOUS | Status: DC
  Administered 2019-08-09 – 2019-08-10 (×4): 3.375 g via INTRAVENOUS
  Filled 2019-08-09 (×5): qty 50

## 2019-08-09 MED ORDER — MORPHINE SULFATE (PF) 4 MG/ML IV SOLN
4.0000 mg | Freq: Once | INTRAVENOUS | Status: AC
  Administered 2019-08-09: 4 mg via INTRAVENOUS
  Filled 2019-08-09: qty 1

## 2019-08-09 MED ORDER — MORPHINE SULFATE (PF) 2 MG/ML IV SOLN
2.0000 mg | Freq: Once | INTRAVENOUS | Status: AC
  Administered 2019-08-09: 2 mg via INTRAVENOUS
  Filled 2019-08-09: qty 1

## 2019-08-09 MED ORDER — VITAMIN D3 25 MCG (1000 UNIT) PO TABS
2000.0000 [IU] | ORAL_TABLET | Freq: Every day | ORAL | Status: DC
  Administered 2019-08-11: 2000 [IU] via ORAL
  Filled 2019-08-09 (×2): qty 2

## 2019-08-09 MED ORDER — ONDANSETRON HCL 4 MG/2ML IJ SOLN
4.0000 mg | Freq: Once | INTRAMUSCULAR | Status: AC
  Administered 2019-08-09: 4 mg via INTRAVENOUS
  Filled 2019-08-09: qty 2

## 2019-08-09 MED ORDER — LISINOPRIL 10 MG PO TABS: 10.0000 mg | ORAL_TABLET | Freq: Every day | ORAL | Status: DC

## 2019-08-09 MED ORDER — ASPIRIN EC 81 MG PO TBEC
81.0000 mg | DELAYED_RELEASE_TABLET | Freq: Every day | ORAL | Status: DC
  Administered 2019-08-11: 81 mg via ORAL
  Filled 2019-08-09 (×2): qty 1

## 2019-08-09 MED ORDER — SODIUM CHLORIDE 0.9 % IV SOLN: Freq: Once | INTRAVENOUS | Status: AC

## 2019-08-09 MED ORDER — GADOBUTROL 1 MMOL/ML IV SOLN
10.0000 mL | Freq: Once | INTRAVENOUS | Status: AC | PRN
  Administered 2019-08-09: 10 mL via INTRAVENOUS

## 2019-08-09 MED ORDER — MORPHINE SULFATE (PF) 2 MG/ML IV SOLN: 2.0000 mg | INTRAVENOUS | Status: DC | PRN

## 2019-08-09 MED ORDER — HYDRALAZINE HCL 25 MG PO TABS
25.0000 mg | ORAL_TABLET | Freq: Three times a day (TID) | ORAL | Status: DC
  Administered 2019-08-09 – 2019-08-11 (×4): 25 mg via ORAL
  Filled 2019-08-09 (×6): qty 1

## 2019-08-09 MED ORDER — SODIUM CHLORIDE 0.9 % IV SOLN: INTRAVENOUS | Status: AC

## 2019-08-09 NOTE — ED Notes (Signed)
[facility] notified (Tammy) - patient ready for transport 

## 2019-08-09 NOTE — ED Notes (Signed)
Pt sister requests contact for any changes, with permission from the pt.  [phone]

## 2019-08-09 NOTE — Consult Note (Signed)
Reason for Consult: Elevated liver tests abnormal CT and ultrasound Referring Physician: Hospital team  Sheila Atkinson is an 64 y.o. female.  HPI: Patient seen and examined and case discussed with the hospital team and her sister and her hospital computer chart reviewed and up till Thursday she had no GI issues but has had 2 bouts of significant biliary colic but is doing better now and has not had any fever and other than a screening colonoscopy in [place] at age 2 has not had any other GI work-up and one of her sisters did have her gallbladder out and she has no other complaints and she has not donated blood but has never been told her liver tests are elevated and did have multiple physical exams while in the [employer] which she is retired from  Past Medical History:  Diagnosis Date  . Anomaly    heart structure - ischemia  . Hyperlipidemia   . Hypertension     Past Surgical History:  Procedure Laterality Date  . CESAREAN SECTION  1995  . TONSILLECTOMY      Family History  Problem Relation Age of Onset  . Heart disease Mother   . Diabetes Mother   . Kidney failure Mother   . Heart failure Father   . Hyperlipidemia Sister   . Hypertension Sister   . Hypertension Brother   . Hyperlipidemia Brother   . Heart attack Maternal Grandmother   . Heart attack Maternal Grandfather   . Stroke Maternal Grandfather   . Heart failure Paternal Grandmother   . Heart attack Paternal Grandfather   . Hyperlipidemia Sister   . Hypertension Sister   . Hyperlipidemia Sister     Social History:  reports that she has never smoked. She has never used smokeless tobacco. She reports current alcohol use. She reports that she does not use drugs.  Allergies:  Allergies  Allergen Reactions  . Erythromycin Rash    Medications: I have reviewed the patient's current medications.  Results for orders placed or performed during the hospital encounter of 08/09/19 (from the past 48 hour(s))  CBC with  Differential     Status: None   Collection Time: 08/09/19  1:48 AM  Result Value Ref Range   WBC 10.1 4.0 - 10.5 K/uL   RBC 4.50 3.87 - 5.11 MIL/uL   Hemoglobin 13.2 12.0 - 15.0 g/dL   HCT 25.8 52.7 - 78.2 %   MCV 90.7 80.0 - 100.0 fL   MCH 29.3 26.0 - 34.0 pg   MCHC 32.4 30.0 - 36.0 g/dL   RDW 42.3 53.6 - 14.4 %   Platelets 202 150 - 400 K/uL   nRBC 0.0 0.0 - 0.2 %   Neutrophils Relative % 73 %   Neutro Abs 7.4 1.7 - 7.7 K/uL   Lymphocytes Relative 19 %   Lymphs Abs 1.9 0.7 - 4.0 K/uL   Monocytes Relative 6 %   Monocytes Absolute 0.6 0.1 - 1.0 K/uL   Eosinophils Relative 1 %   Eosinophils Absolute 0.1 0.0 - 0.5 K/uL   Basophils Relative 0 %   Basophils Absolute 0.0 0.0 - 0.1 K/uL   Immature Granulocytes 1 %   Abs Immature Granulocytes 0.05 0.00 - 0.07 K/uL    Comment: Performed at [facility], [address] [place], [place] [address]  Comprehensive metabolic panel     Status: Abnormal   Collection Time: 08/09/19  1:48 AM  Result Value Ref Range   Sodium 138  135 - 145 mmol/L   Potassium 3.9 3.5 - 5.1 mmol/L   Chloride 101 98 - 111 mmol/L   CO2 26 22 - 32 mmol/L   Glucose, Bld 106 (H) 70 - 99 mg/dL    Comment: Glucose reference range applies only to samples taken after fasting for at least 8 hours.   BUN 23 8 - 23 mg/dL   Creatinine, Ser 7.04 (H) 0.44 - 1.00 mg/dL   Calcium 9.2 8.9 - 88.8 mg/dL   Total Protein 7.0 6.5 - 8.1 g/dL   Albumin 4.1 3.5 - 5.0 g/dL   AST 916 (H) 15 - 41 U/L   ALT 169 (H) 0 - 44 U/L   Alkaline Phosphatase 84 38 - 126 U/L   Total Bilirubin 0.4 0.3 - 1.2 mg/dL   GFR calc non Af Amer 50 (L) >60 mL/min   GFR calc Af Amer 58 (L) >60 mL/min   Anion gap 11 5 - 15    Comment: Performed at [facility], [address] [place], [place] [address]  Lipase, blood     Status: None   Collection Time: 08/09/19  1:48 AM  Result Value Ref Range   Lipase 41 11 - 51 U/L    Comment: Performed at [facility], [address] [place], [place] [address]  Troponin I (High Sensitivity)     Status: None   Collection Time: 08/09/19  1:48 AM  Result Value Ref Range   Troponin I (High Sensitivity) 3 <18 ng/L    Comment: (NOTE) Elevated high sensitivity troponin I (hsTnI) values and significant  changes across serial measurements may suggest ACS but many other  chronic and acute conditions are known to elevate hsTnI results.  Refer to the "Links" section for chest pain algorithms and additional  guidance. Performed at [facility], [address] [place], [place] [address]   Urinalysis, Routine w reflex microscopic     Status: Abnormal   Collection Time: 08/09/19  2:40 AM  Result Value Ref Range   Color, Urine YELLOW YELLOW   APPearance CLEAR CLEAR   Specific Gravity, Urine 1.010 1.005 - 1.030   pH 6.5 5.0 - 8.0   Glucose, UA NEGATIVE NEGATIVE mg/dL   Hgb urine dipstick NEGATIVE NEGATIVE   Bilirubin Urine NEGATIVE NEGATIVE   Ketones, ur NEGATIVE NEGATIVE mg/dL   Protein, ur NEGATIVE NEGATIVE mg/dL   Nitrite NEGATIVE NEGATIVE   Leukocytes,Ua SMALL (A) NEGATIVE    Comment: Performed at [facility], [address] [place], [place] [address]  Urinalysis, Microscopic (reflex)     Status: Abnormal   Collection Time: 08/09/19  2:40 AM  Result Value Ref Range   RBC / HPF 0-5 0 - 5 RBC/hpf   WBC, UA 0-5 0 - 5 WBC/hpf   Bacteria, UA RARE (A) NONE SEEN   Squamous Epithelial / LPF 0-5 0 - 5    Comment: Performed at [facility], [address] [place], [place] [address]  CBC     Status: None   Collection Time: 08/09/19  8:02 AM  Result Value Ref Range   WBC 5.3 4.0 - 10.5 K/uL   RBC 4.17 3.87 - 5.11 MIL/uL   Hemoglobin 12.3 12.0 - 15.0 g/dL   HCT 94.8 01.6 - 55.3 %   MCV 90.9 80.0 - 100.0 fL   MCH 29.5 26.0 - 34.0 pg   MCHC 32.5 30.0 - 36.0 g/dL  RDW 13.0 11.5 - 15.5 %   Platelets 187 150 - 400 K/uL   nRBC 0.0 0.0 - 0.2 %    Comment: Performed at [facility], [address], [place], [place] [address]  Creatinine, serum     Status: None   Collection Time: 08/09/19  8:02 AM  Result Value Ref Range   Creatinine, Ser 0.84 0.44 - 1.00 mg/dL   GFR calc non Af Amer >60 >60 mL/min   GFR calc Af Amer >60 >60 mL/min    Comment: Performed at [facility], [address] [place], [place] [address]  Troponin I (High Sensitivity)     Status: None   Collection Time: 08/09/19  8:02 AM  Result Value Ref Range   Troponin I (High Sensitivity) 4 <18 ng/L    Comment: (NOTE) Elevated high sensitivity troponin I (hsTnI) values and significant  changes across serial measurements may suggest ACS but many other  chronic and acute conditions are known to elevate hsTnI results.  Refer to the "Links" section for chest pain algorithms and additional  guidance. Performed at [facility], [address], [place], [place] [address]   Troponin I (High Sensitivity)     Status: None   Collection Time: 08/09/19 10:12 AM  Result Value Ref Range   Troponin I (High Sensitivity) 4 <18 ng/L    Comment: (NOTE) Elevated high sensitivity troponin I (hsTnI) values and significant  changes across serial measurements may suggest ACS but many other  chronic and acute conditions are known to elevate hsTnI results.  Refer to the "Links" section for chest pain algorithms and additional  guidance. Performed at [facility], [address] [place], [place] [address]     DG Chest 2 View  Result Date: 08/09/2019 CLINICAL DATA:  Intermittent chest pain EXAM: CHEST - 2 VIEW COMPARISON:  None. FINDINGS: Cardiac shadows within normal limits. Aortic calcifications are seen. The lungs are well aerated bilaterally. No focal infiltrate is seen. No bony abnormality is noted. IMPRESSION: No active cardiopulmonary disease. Electronically Signed   By: Alcide Clever M.D.   On: 08/09/2019 02:51   CT ABDOMEN PELVIS W  CONTRAST  Result Date: 08/09/2019 CLINICAL DATA:  Upper abdominal pain for 2 days, initial encounter EXAM: CT ABDOMEN AND PELVIS WITH CONTRAST TECHNIQUE: Multidetector CT imaging of the abdomen and pelvis was performed using the standard protocol following bolus administration of intravenous contrast. CONTRAST:  OMNIPAQUE IOHEXOL 300 MG/ML  SOLN COMPARISON:  None. FINDINGS: Lower chest: No acute abnormality. Hepatobiliary: Liver is within normal limits. Gallbladder is well distended. Dilatation of the common bile duct is noted to 11 mm without definitive mass lesion or stone identified. Pancreas: Pancreas is within normal limits. Spleen: Normal in size without focal abnormality. Adrenals/Urinary Tract: Adrenal glands are within normal limits. Kidneys demonstrate a normal enhancement pattern bilaterally. Parapelvic cysts are noted on the left. No renal calculi or obstructive changes are seen. Bladder is partially distended. Stomach/Bowel: Scattered diverticular changes noted within the sigmoid colon. No evidence of diverticulitis. No obstructive or inflammatory changes of the colon are seen. The appendix is within normal limits. No small bowel abnormality is noted. Stomach is within normal limits. Some fluid is noted in the distal esophagus likely related to reflux. Small hiatal hernia is seen. Vascular/Lymphatic: Aortic atherosclerosis. No enlarged abdominal or pelvic lymph nodes. Reproductive: Uterus and bilateral adnexa are unremarkable. Mild pelvic varices are noted on the left. Other: No abdominal wall  hernia or abnormality. No abdominopelvic ascites. Musculoskeletal: Degenerative changes of the lumbar spine are noted. No acute bony abnormality is noted. IMPRESSION: Gallbladder is well distended with prominence of the intrahepatic and extrahepatic biliary tree without definitive obstructive lesion. Correlation with laboratory values is recommended. Diverticulosis without diverticulitis. Chronic appearing  changes as described above. Electronically Signed   By: Inez Catalina M.D.   On: 08/09/2019 03:04   US Abdomen Limited RUQ  Result Date: 08/09/2019 CLINICAL DATA:  Upper abdominal pain EXAM: ULTRASOUND ABDOMEN LIMITED RIGHT UPPER QUADRANT COMPARISON:  None. FINDINGS: Gallbladder: Within the gallbladder, there are echogenic foci which move and shadow consistent with cholelithiasis. Largest gallstone measures 8 mm in length. Gallbladder wall is slightly thickened with slight pericholecystic fluid. No sonographic Murphy sign noted by sonographer. Common bile duct: Diameter: 6 mm. No intrahepatic or extrahepatic biliary duct dilatation. Liver: No focal lesion identified. Within normal limits in parenchymal echogenicity. Portal vein is patent on color Doppler imaging with normal direction of blood flow towards the liver. Other: None. IMPRESSION: Cholelithiasis. Gallbladder wall is mildly thickened with slight pericholecystic fluid. These findings suggest potential acute cholecystitis. It may be prudent in this regard to consider nuclear medicine hepatobiliary imaging study to assess for cystic duct patency. Study otherwise unremarkable. Electronically Signed   By: Lowella Grip III M.D.   On: 08/09/2019 10:02    Review of Systems negative except above Blood pressure 124/69, pulse 65, temperature 98.1 F (36.7 C), temperature source Oral, resp. rate 18, height 5\' 10"  (1.778 m), weight 90 kg, SpO2 94 %. Physical Exam vital signs stable afebrile no acute distress currently abdomen is soft nontender good bowel sounds labs ultrasound CT reviewed  Assessment/Plan: Gallstones and elevated transaminases Plan: We discussed MRCP and if positive will proceed with ERCP and the risks benefits methods and success rate of that was discussed however if MRCP is negative and liver tests decreased tomorrow morning would proceed with lap chole and Intra-Op cholangiogram just to be sure and will ask my rounding partner to  check on tomorrow and please call me sooner if I could be of any further assistance today and in the meantime since she is better will allow clear liquids today  Vasiliki Smaldone E 08/09/2019, 12:06 PM

## 2019-08-09 NOTE — Progress Notes (Signed)
Pharmacy Antibiotic Note  Sheila Atkinson is a 64 y.o. female admitted on 08/09/2019 with r/o acute cholecystitis.  Pharmacy has been consulted for Zosyn dosing.  Plan: Zosyn 3.375g IV q8h (4 hour infusion).  Height: 5\' 10"  (177.8 cm) Weight: 90 kg (198 lb 6.6 oz) IBW/kg (Calculated) : 68.5  Temp (24hrs), Avg:98 F (36.7 C), Min:97.8 F (36.6 C), Max:98.1 F (36.7 C)  Recent Labs  Lab 08/09/19 0148 08/09/19 0802  WBC 10.1 5.3  CREATININE 1.16* 0.84    Estimated Creatinine Clearance: 83.4 mL/min (by C-G formula based on SCr of 0.84 mg/dL).    Allergies  Allergen Reactions  . Erythromycin Rash    Antimicrobials this admission: 4/4 Zosyn >>    Dose adjustments this admission:    Microbiology results:  Thank you for allowing pharmacy to be a part of this patient's care.  6/4, PharmD 08/09/2019 11:59 AM

## 2019-08-09 NOTE — ED Provider Notes (Addendum)
TIME SEEN: 1:57 AM  CHIEF COMPLAINT: Chest pain, upper abdominal pain  HPI: Patient is a 64 year old female with history of hypertension, hyperlipidemia who presents to the emergency department with upper abdominal pain and lower chest pain that started at 11:30 PM last night.  States that it feels like a "vice" around her.  She states symptoms were worse after eating greasy foods.  She did have a similar episode about 48 hours ago that resolved on its own.  She did have nausea 2 days ago but none currently.  No vomiting.  Normal bowel movements.  No diarrhea, bloody stools or melena.  Has had C-sections before but no other abdominal surgery.  Denies dysuria, hematuria, vaginal bleeding or discharge.  No history of CAD, PE or DVT.  No lower extremity swelling or pain.  No other aggravating or alleviating factors.  No radiation of pain.  Patient and sister at bedside were concerned this could be her gallbladder versus anginal equivalent.  Denies associated shortness of breath, diaphoresis, dizziness.  Sister Lelan Pons - [phone]  History is provided by patient and sister at bedside.  ROS: See HPI Constitutional: no fever  Eyes: no drainage  ENT: no runny nose   Cardiovascular:   chest pain  Resp: no SOB  GI: no vomiting GU: no dysuria Integumentary: no rash  Allergy: no hives  Musculoskeletal: no leg swelling  Neurological: no slurred speech ROS otherwise negative  PAST MEDICAL HISTORY/PAST SURGICAL HISTORY:  Past Medical History:  Diagnosis Date  . Anomaly    heart structure - ischemia  . Hyperlipidemia   . Hypertension     MEDICATIONS:  Prior to Admission medications   Medication Sig Start Date End Date Taking? Authorizing Provider  aspirin EC 81 MG tablet Take 1 tablet by mouth daily. 03/24/18  Yes [provider]  Cholecalciferol (VITAMIN D) 50 MCG (2000 UT) tablet Take 1 tablet by mouth daily. 05/12/18  Yes [provider]  lisinopril (ZESTRIL) 10 MG  tablet Take 1 tablet (10 mg total) by mouth daily. 05/13/19  Yes Hilty, Nadean Corwin, MD  rosuvastatin (CRESTOR) 20 MG tablet TAKE ONE TABLET BY MOUTH ONE TIME DAILY 06/19/19  Yes Hilty, Nadean Corwin, MD    ALLERGIES:  Allergies  Allergen Reactions  . Erythromycin Rash    SOCIAL HISTORY:  Social History   Tobacco Use  . Smoking status: Never Smoker  . Smokeless tobacco: Never Used  Substance Use Topics  . Alcohol use: Yes    Comment: occassional    FAMILY HISTORY: Family History  Problem Relation Age of Onset  . Heart disease Mother   . Diabetes Mother   . Kidney failure Mother   . Heart failure Father   . Hyperlipidemia Sister   . Hypertension Sister   . Hypertension Brother   . Hyperlipidemia Brother   . Heart attack Maternal Grandmother   . Heart attack Maternal Grandfather   . Stroke Maternal Grandfather   . Heart failure Paternal Grandmother   . Heart attack Paternal Grandfather   . Hyperlipidemia Sister   . Hypertension Sister   . Hyperlipidemia Sister     EXAM: BP (!) 160/88 (BP Location: Right Arm)   Pulse 96   Temp 98.1 F (36.7 C) (Oral)   Resp 18   Ht 5\' 10"  (1.778 m)   Wt 85.3 kg   SpO2 100%   BMI 26.98 kg/m  CONSTITUTIONAL: Alert and oriented and responds appropriately to questions. Well-appearing; well-nourished, afebrile, nontoxic, pleasant, appears  uncomfortable but not in distress  HEAD: Normocephalic EYES: Conjunctivae clear, pupils appear equal, EOM appear intact ENT: normal nose; moist mucous membranes NECK: Supple, normal ROM CARD: RRR; S1 and S2 appreciated; no murmurs, no clicks, no rubs, no gallops RESP: Normal chest excursion without splinting or tachypnea; breath sounds clear and equal bilaterally; no wheezes, no rhonchi, no rales, no hypoxia or respiratory distress, speaking full sentences ABD/GI: Normal bowel sounds; non-distended; soft, tender throughout the abdomen but mostly in the upper quadrants and epigastric region.  She does have  a positive Murphy sign.  Do not appreciate hepatosplenomegaly.  She does have some tenderness in the right and left lower quadrant without guarding or rebound. BACK:  The back appears normal EXT: Normal ROM in all joints; no deformity noted, no edema; no cyanosis, no calf tenderness or calf swelling SKIN: Normal color for age and race; warm; no rash on exposed skin NEURO: Moves all extremities equally PSYCH: The patient's mood and manner are appropriate.   MEDICAL DECISION MAKING: Patient here with atypical chest pain and upper abdominal pain.  Differential includes gastritis, peptic ulcer, cholelithiasis, cholecystitis, pancreatitis, ACS.  Doubt dissection or PE at this time.  I have reviewed patient's EKG and EKG shows no ischemic abnormality, arrhythmia, interval changes.  Will obtain cardiac labs as well as check LFTs, lipase today.  Unfortunately at this time we are unable to get an ultrasound at [facility]  We have discussed possibility of transfer for ultrasound versus proceeding with CT.  I feel CT is reasonable given she does have diffuse pain on exam.  Patient and sister also agree.  Will provide with GI cocktail, Protonix for symptomatic relief and reassess.  ED PROGRESS: Patient reports no significant improvement in pain after GI cocktail and Protonix.  Pain has worsened.  We will give morphine and Zofran.  She requests a very small dose of narcotics as she is opiate nave.  Her AST and ALT are elevated today.  Normal lipase.  CT has been independently reviewed and interpreted by myself and radiologist.  CT shows a well distended gallbladder with prominence of the intra and extrahepatic biliary tree without definitive obstructive lesion.  Common bile duct measuring up to 11 mm.  No cholecystitis seen on CT imaging.  Discussed concern for choledocholithiasis or other lesion causing dilatation and elevated liver tests today.  Suspect this is the cause of her pain.  Discussed with  patient and sister at bedside that she will likely need a right upper quadrant ultrasound and may need an MRCP if this imaging is not definitive.  Will discuss with hospitalist for direct admission.  Unable to obtain ultrasound here at [facility] at this time.  Cardiac work-up is reassuring.  Chest x-ray has been independently reviewed and interpreted by myself and radiologist.  No infiltrate, pneumothorax, edema.  Troponin is negative.  Low suspicion for ACS.   3:57 AM Discussed patient's case with hospitalist, Dr. Morene Antu.  I have recommended admission and patient (and family if present) agree with this plan. Admitting physician will place admission orders.   I reviewed all nursing notes, vitals, pertinent previous records and interpreted all EKGs, lab and urine results, imaging (as available).   4:45 AM  Pt reports feeling better after 4 mg of IV morphine.  No resting comfortably.  Awaiting transport to [facility].    EKG Interpretation  Date/Time:  Sunday August 09 2019 01:33:24 EDT Ventricular Rate:  89 PR Interval:  QRS Duration: 103 QT Interval:  357 QTC Calculation: 435 R Axis:   46 Text Interpretation: Sinus rhythm Low voltage, precordial leads Baseline wander in lead(s) V6 No old tracing to compare Confirmed by Annalynn Centanni, Baxter Hire [phone]) on 08/09/2019 1:38:14 AM         Shanon Payor was evaluated in Emergency Department on 08/09/2019 for the symptoms described in the history of present illness. She was evaluated in the context of the global COVID-19 pandemic, which necessitated consideration that the patient might be at risk for infection with the SARS-CoV-2 virus that causes COVID-19. Institutional protocols and algorithms that pertain to the evaluation of patients at risk for COVID-19 are in a state of rapid change based on information released by regulatory bodies including the CDC and federal and state organizations. These policies and algorithms were followed during  the patient's care in the ED.      Sheikh Leverich, Layla Maw, DO 08/09/19 0447    Malynda Smolinski, Layla Maw, DO 08/09/19 5809

## 2019-08-09 NOTE — H&P (Signed)
History and Physical    Sheila Atkinson GBT:[id number] DOB: 04-17-1956 DOA: 08/09/2019  PCP: System, Pcp Not In  Patient coming from: home  I have personally briefly reviewed patient's old medical records in [place]  Chief Complaint: chest /abdominal pain   HPI: Sheila Atkinson is a 64 y.o. 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 Patient described pain as squeezing/gripping pain lower chest and upper abdomen. She describes the pain as very severe indigestion.She notes symptoms were made worse after eating greasy foods.  She states the pain was a 10 at its worse intensity and was relieved after second round of morphine in ED. She noted no associated nausea, diarrhea,fever , chills,palpitations, presyncope, back pain  Or scapular pain with this episode. However she states she did have similar episode around 48 hours ago linked also with eating at which time she did feel nauseated but never had emesis. She notes that this episode resolved on its own.  She notes she has loose non-bloody stools last of which was 24 hours ago. Currently , she states she is not experiencing any pain. She has not had anything to eat since admission to ED.     On further ros she denies cardiac type chest pain, and notes she exercises frequently without chest pain or shortness of breath. She notes she has occasional palpitations but these episodes are fleeting and never associated with other symptoms. She denies any fever/ chills, sob , dysuria or recent weight loss. She also denies any recent URI symptoms.  ED Course:  Afeb, bp 160/99, hr 96, rr 18 sat 1005 on ra  Wbc; 5.3, hgb 1.2 Cr: 1.16 was 0.8 AST/ALT elevated : 223/169 ALK wnL: 84 LIPASE:41 CE: NEGATIVE Ct abd: distended gallbladder :cbd  52m U/s : question acute cholecystitis Tx: gi cocktail, morphine x 2  Review of Systems: As per HPI otherwise 10 point review of systems negative.   Past Medical History:  Diagnosis Date  . Anomaly    heart structure - ischemia  . Hyperlipidemia   . Hypertension     Past Surgical History:  Procedure Laterality Date  . CESAREAN SECTION  1995  . TONSILLECTOMY       reports that she has never smoked. She has never used smokeless tobacco. She reports current alcohol use. She reports that she does not use drugs.  Allergies  Allergen Reactions  . Erythromycin Rash    Family History  Problem Relation Age of Onset  . Heart disease Mother   . Diabetes Mother   . Kidney failure Mother   . Heart failure Father   . Hyperlipidemia Sister   . Hypertension Sister   . Hypertension Brother   . Hyperlipidemia Brother   . Heart attack Maternal Grandmother   . Heart attack Maternal Grandfather   . Stroke Maternal Grandfather   . Heart failure Paternal Grandmother   . Heart attack Paternal Grandfather   . Hyperlipidemia Sister   . Hypertension Sister   . Hyperlipidemia Sister     Prior to Admission medications   Medication Sig Start Date End Date Taking? Authorizing Provider  aspirin EC 81 MG tablet Take 1 tablet by mouth daily. 03/24/18  Yes [provider]  Cholecalciferol (VITAMIN D) 50 MCG (2000 UT) tablet Take 2,000 Units by mouth daily.  05/12/18  Yes [provider]  lisinopril (ZESTRIL) 10 MG tablet Take 1 tablet (10 mg total) by mouth daily. 05/13/19  Yes Hilty, Nadean Corwin, MD  olopatadine (PATADAY) 0.1 % ophthalmic solution Place 1 drop into both eyes 2 (two) times daily as needed for allergies.   Yes [provider]  rosuvastatin (CRESTOR) 20 MG tablet TAKE ONE TABLET BY MOUTH ONE TIME DAILY Patient taking differently: Take 20 mg by mouth daily.  06/19/19  Yes Pixie Casino, MD    Physical Exam: Vitals:   08/09/19 0431 08/09/19 0500 08/09/19  0526 08/09/19 0639  BP: (!) 144/71 (!) 144/64 130/69 114/66  Pulse: 94 94 88 73  Resp:  16 (!) 21 17  Temp:    97.8 F (36.6 C)  TempSrc:    Oral  SpO2: 94% 95% 93% 93%  Weight:    90 kg  Height:    5' 10"  (1.778 m)    Constitutional: NAD, calm, comfortable Vitals:   08/09/19 0431 08/09/19 0500 08/09/19 0526 08/09/19 0639  BP: (!) 144/71 (!) 144/64 130/69 114/66  Pulse: 94 94 88 73  Resp:  16 (!) 21 17  Temp:    97.8 F (36.6 C)  TempSrc:    Oral  SpO2: 94% 95% 93% 93%  Weight:    90 kg  Height:    5' 10"  (1.778 m)   Eyes: PERRL, lids and conjunctivae normal ENMT: Mucous membranes are moist. Posterior pharynx clear of any exudate or lesions.Normal dentition.  Neck: normal, supple, no masses, no thyromegaly Respiratory: clear to auscultation bilaterally, no wheezing, no crackles. Normal respiratory effort. No accessory muscle use.  Cardiovascular: Regular rate and rhythm, no murmurs / rubs / gallops. No extremity edema. 2+ pedal pulses. No carotid bruits.  Abdomen: RUQ tenderness, no masses palpated. No hepatosplenomegaly. Bowel sounds positive.  Musculoskeletal: no clubbing / cyanosis. No joint deformity upper and lower extremities. Good ROM, no contractures. Normal muscle tone.  Skin: no rashes, lesions, ulcers. No induration Neurologic: CN 2-12 grossly intact. Sensation intact, DTR normal. Strength 5/5 in all 4.  Psychiatric: Normal judgment and insight. Alert and oriented x 3. Normal mood.    Labs on Admission: I have personally reviewed following labs and imaging studies  CBC: Recent Labs  Lab 08/09/19 0148  WBC 10.1  NEUTROABS 7.4  HGB 13.2  HCT 40.8  MCV 90.7  PLT 106   Basic Metabolic Panel: Recent Labs  Lab 08/09/19 0148  NA 138  K 3.9  CL 101  CO2 26  GLUCOSE 106*  BUN 23  CREATININE 1.16*  CALCIUM 9.2   GFR: Estimated Creatinine Clearance: 60.4 mL/min (A) (by C-G formula based on SCr of 1.16 mg/dL (H)). Liver Function Tests: Recent Labs    Lab 08/09/19 0148  AST 223*  ALT 169*  ALKPHOS 84  BILITOT 0.4  PROT 7.0  ALBUMIN 4.1   Recent Labs  Lab 08/09/19 0148  LIPASE 41   No results for input(s): AMMONIA in the last 168 hours. Coagulation Profile: No results for input(s): INR, PROTIME in the last 168 hours. Cardiac Enzymes: No results for input(s): CKTOTAL, CKMB, CKMBINDEX, TROPONINI in the last 168 hours. BNP (last 3 results) No results for input(s): PROBNP in the last 8760 hours. HbA1C: No results for input(s): HGBA1C in the last 72 hours. CBG: No results for input(s): GLUCAP in the last 168 hours. Lipid Profile: No results for input(s): CHOL, HDL, LDLCALC, TRIG, CHOLHDL, LDLDIRECT in the last 72 hours. Thyroid  Function Tests: No results for input(s): TSH, T4TOTAL, FREET4, T3FREE, THYROIDAB in the last 72 hours. Anemia Panel: No results for input(s): VITAMINB12, FOLATE, FERRITIN, TIBC, IRON, RETICCTPCT in the last 72 hours. Urine analysis:    Component Value Date/Time   COLORURINE YELLOW 08/09/2019 0240   APPEARANCEUR CLEAR 08/09/2019 0240   LABSPEC 1.010 08/09/2019 0240   PHURINE 6.5 08/09/2019 0240   GLUCOSEU NEGATIVE 08/09/2019 0240   HGBUR NEGATIVE 08/09/2019 0240   BILIRUBINUR NEGATIVE 08/09/2019 0240   KETONESUR NEGATIVE 08/09/2019 0240   PROTEINUR NEGATIVE 08/09/2019 0240   NITRITE NEGATIVE 08/09/2019 0240   LEUKOCYTESUR SMALL (A) 08/09/2019 0240    Radiological Exams on Admission: DG Chest 2 View  Result Date: 08/09/2019 CLINICAL DATA:  Intermittent chest pain EXAM: CHEST - 2 VIEW COMPARISON:  None. FINDINGS: Cardiac shadows within normal limits. Aortic calcifications are seen. The lungs are well aerated bilaterally. No focal infiltrate is seen. No bony abnormality is noted. IMPRESSION: No active cardiopulmonary disease. Electronically Signed   By: Inez Catalina M.D.   On: 08/09/2019 02:51   CT ABDOMEN PELVIS W CONTRAST  Result Date: 08/09/2019 CLINICAL DATA:  Upper abdominal pain for 2 days,  initial encounter EXAM: CT ABDOMEN AND PELVIS WITH CONTRAST TECHNIQUE: Multidetector CT imaging of the abdomen and pelvis was performed using the standard protocol following bolus administration of intravenous contrast. CONTRAST:  169m OMNIPAQUE IOHEXOL 300 MG/ML  SOLN COMPARISON:  None. FINDINGS: Lower chest: No acute abnormality. Hepatobiliary: Liver is within normal limits. Gallbladder is well distended. Dilatation of the common bile duct is noted to 11 mm without definitive mass lesion or stone identified. Pancreas: Pancreas is within normal limits. Spleen: Normal in size without focal abnormality. Adrenals/Urinary Tract: Adrenal glands are within normal limits. Kidneys demonstrate a normal enhancement pattern bilaterally. Parapelvic cysts are noted on the left. No renal calculi or obstructive changes are seen. Bladder is partially distended. Stomach/Bowel: Scattered diverticular changes noted within the sigmoid colon. No evidence of diverticulitis. No obstructive or inflammatory changes of the colon are seen. The appendix is within normal limits. No small bowel abnormality is noted. Stomach is within normal limits. Some fluid is noted in the distal esophagus likely related to reflux. Small hiatal hernia is seen. Vascular/Lymphatic: Aortic atherosclerosis. No enlarged abdominal or pelvic lymph nodes. Reproductive: Uterus and bilateral adnexa are unremarkable. Mild pelvic varices are noted on the left. Other: No abdominal wall hernia or abnormality. No abdominopelvic ascites. Musculoskeletal: Degenerative changes of the lumbar spine are noted. No acute bony abnormality is noted. IMPRESSION: Gallbladder is well distended with prominence of the intrahepatic and extrahepatic biliary tree without definitive obstructive lesion. Correlation with laboratory values is recommended. Diverticulosis without diverticulitis. Chronic appearing changes as described above. Electronically Signed   By: MInez CatalinaM.D.   On:  08/09/2019 03:04    EKG: Independently reviewed.  EKG: sinus no  St -twave changes,    Assessment/Plan Acute bilary Colic with concern for acute cholecystitis /cbd(common bile duct) obstruction  - ct scan with cbd of 140m - u/s noting no cbd dilation , +stones GD 82m65mconcern for acute cholecystitis  -continue npo status  - surgery /gi consult  - mrcp ordered , gi /surgery to decide on further modality ?need for HIDA scan  -zosyn due to concern for acute cholecystitis : note no increase wbc or fever or chills / no systemic signs of infection/ de-escalate antibiotics as able  -supportive ivfs /antiemetics /pain medications   AKI  -due to volume depletion  -  hold nephrotoxic medications  - ivfs  -follow labs   HTN  -on zestril  - hold currently due to mild aki  -hydralazine po tid for now  -resume zestril once cr back to baseline    HLD -continue statin   DVT prophylaxis: Heparin  Code Status:FUll Family Communication: n/a  Disposition Plan: 3-4 days  Consults called: [facility] Latino Dr Rosendo Gros surgery  Admission status: inpatient   Clance Boll MD [facility]  If 7PM-7AM, please contact night-coverage [email or web address] Password [email or web address]  08/09/2019, 8:07 AM

## 2019-08-09 NOTE — Consult Note (Signed)
Reason for Consult: Abdominal pain Referring Physician: Dr. [name] is an 64 y.o. female.  HPI: Patient is a 64 year old female, with a history of hypertension, hyperlipidemia, who comes in with a 2 to 3-day history of abdominal pain.  Patient states that she mainly had epigastric abdominal pain.  She states that she had this approximate 3 days ago that recurred last night prior to admission.  She states that the pain was significant severe.  She states that on the first occurrence she had some nausea however no emesis.  Patient denies emesis are most recent episode.  Secondary to pain patient presented to the ER.  Ultrasound that was ordered in the ER as well as CT scan was significant for gallstones, common bile duct of 6 mm.  Patient had elevated transaminases however T bili was normal.  Patient also had normal lipase.  Past Medical History:  Diagnosis Date  . Anomaly    heart structure - ischemia  . Hyperlipidemia   . Hypertension     Past Surgical History:  Procedure Laterality Date  . CESAREAN SECTION  1995  . TONSILLECTOMY      Family History  Problem Relation Age of Onset  . Heart disease Mother   . Diabetes Mother   . Kidney failure Mother   . Heart failure Father   . Hyperlipidemia Sister   . Hypertension Sister   . Hypertension Brother   . Hyperlipidemia Brother   . Heart attack Maternal Grandmother   . Heart attack Maternal Grandfather   . Stroke Maternal Grandfather   . Heart failure Paternal Grandmother   . Heart attack Paternal Grandfather   . Hyperlipidemia Sister   . Hypertension Sister   . Hyperlipidemia Sister     Social History:  reports that she has never smoked. She has never used smokeless tobacco. She reports current alcohol use. She reports that she does not use drugs.  Allergies:  Allergies  Allergen Reactions  . Erythromycin Rash    Medications: I have reviewed the patient's current medications.  Results for orders placed  or performed during the hospital encounter of 08/09/19 (from the past 48 hour(s))  CBC with Differential     Status: None   Collection Time: 08/09/19  1:48 AM  Result Value Ref Range   WBC 10.1 4.0 - 10.5 K/uL   RBC 4.50 3.87 - 5.11 MIL/uL   Hemoglobin 13.2 12.0 - 15.0 g/dL   HCT 40.8 36.0 - 46.0 %   MCV 90.7 80.0 - 100.0 fL   MCH 29.3 26.0 - 34.0 pg   MCHC 32.4 30.0 - 36.0 g/dL   RDW 13.1 11.5 - 15.5 %   Platelets 202 150 - 400 K/uL   nRBC 0.0 0.0 - 0.2 %   Neutrophils Relative % 73 %   Neutro Abs 7.4 1.7 - 7.7 K/uL   Lymphocytes Relative 19 %   Lymphs Abs 1.9 0.7 - 4.0 K/uL   Monocytes Relative 6 %   Monocytes Absolute 0.6 0.1 - 1.0 K/uL   Eosinophils Relative 1 %   Eosinophils Absolute 0.1 0.0 - 0.5 K/uL   Basophils Relative 0 %   Basophils Absolute 0.0 0.0 - 0.1 K/uL   Immature Granulocytes 1 %   Abs Immature Granulocytes 0.05 0.00 - 0.07 K/uL    Comment: Performed at [facility], [address] [place], [place] [address]  Comprehensive metabolic panel     Status: Abnormal   Collection Time: 08/09/19  1:48 AM  Result Value Ref Range   Sodium 138 135 - 145 mmol/L   Potassium 3.9 3.5 - 5.1 mmol/L   Chloride 101 98 - 111 mmol/L   CO2 26 22 - 32 mmol/L   Glucose, Bld 106 (H) 70 - 99 mg/dL    Comment: Glucose reference range applies only to samples taken after fasting for at least 8 hours.   BUN 23 8 - 23 mg/dL   Creatinine, Ser 1.47 (H) 0.44 - 1.00 mg/dL   Calcium 9.2 8.9 - 82.9 mg/dL   Total Protein 7.0 6.5 - 8.1 g/dL   Albumin 4.1 3.5 - 5.0 g/dL   AST 562 (H) 15 - 41 U/L   ALT 169 (H) 0 - 44 U/L   Alkaline Phosphatase 84 38 - 126 U/L   Total Bilirubin 0.4 0.3 - 1.2 mg/dL   GFR calc non Af Amer 50 (L) >60 mL/min   GFR calc Af Amer 58 (L) >60 mL/min   Anion gap 11 5 - 15    Comment: Performed at [facility], [address] [place], [place] [address]  Lipase, blood     Status: None   Collection Time: 08/09/19  1:48 AM  Result Value Ref Range    Lipase 41 11 - 51 U/L    Comment: Performed at [facility], [address] [place], [place] [address]  Troponin I (High Sensitivity)     Status: None   Collection Time: 08/09/19  1:48 AM  Result Value Ref Range   Troponin I (High Sensitivity) 3 <18 ng/L    Comment: (NOTE) Elevated high sensitivity troponin I (hsTnI) values and significant  changes across serial measurements may suggest ACS but many other  chronic and acute conditions are known to elevate hsTnI results.  Refer to the "Links" section for chest pain algorithms and additional  guidance. Performed at [facility], [address] [place], [place] [address]   Urinalysis, Routine w reflex microscopic     Status: Abnormal   Collection Time: 08/09/19  2:40 AM  Result Value Ref Range   Color, Urine YELLOW YELLOW   APPearance CLEAR CLEAR   Specific Gravity, Urine 1.010 1.005 - 1.030   pH 6.5 5.0 - 8.0   Glucose, UA NEGATIVE NEGATIVE mg/dL   Hgb urine dipstick NEGATIVE NEGATIVE   Bilirubin Urine NEGATIVE NEGATIVE   Ketones, ur NEGATIVE NEGATIVE mg/dL   Protein, ur NEGATIVE NEGATIVE mg/dL   Nitrite NEGATIVE NEGATIVE   Leukocytes,Ua SMALL (A) NEGATIVE    Comment: Performed at [facility], [address] [place], [place] [address]  Urinalysis, Microscopic (reflex)     Status: Abnormal   Collection Time: 08/09/19  2:40 AM  Result Value Ref Range   RBC / HPF 0-5 0 - 5 RBC/hpf   WBC, UA 0-5 0 - 5 WBC/hpf   Bacteria, UA RARE (A) NONE SEEN   Squamous Epithelial / LPF 0-5 0 - 5    Comment: Performed at [facility], [address] [place], [place] [address]  HIV Antibody (routine testing w rflx)     Status: None   Collection Time: 08/09/19  8:02 AM  Result Value Ref Range   HIV Screen 4th Generation wRfx NON REACTIVE NON REACTIVE    Comment: Performed at [facility], [address] [place], [place] [address]  CBC     Status: None   Collection Time:  08/09/19  8:02 AM   Result Value Ref Range   WBC 5.3 4.0 - 10.5 K/uL   RBC 4.17 3.87 - 5.11 MIL/uL   Hemoglobin 12.3 12.0 - 15.0 g/dL   HCT 69.637.9 29.536.0 - 28.446.0 %   MCV 90.9 80.0 - 100.0 fL   MCH 29.5 26.0 - 34.0 pg   MCHC 32.5 30.0 - 36.0 g/dL   RDW 13.213.0 44.011.5 - 10.215.5 %   Platelets 187 150 - 400 K/uL   nRBC 0.0 0.0 - 0.2 %    Comment: Performed at [facility], [address] [place], [place] [address]  Creatinine, serum     Status: None   Collection Time: 08/09/19  8:02 AM  Result Value Ref Range   Creatinine, Ser 0.84 0.44 - 1.00 mg/dL   GFR calc non Af Amer >60 >60 mL/min   GFR calc Af Amer >60 >60 mL/min    Comment: Performed at [facility], [address] [place], [place] [address]  Troponin I (High Sensitivity)     Status: None   Collection Time: 08/09/19  8:02 AM  Result Value Ref Range   Troponin I (High Sensitivity) 4 <18 ng/L    Comment: (NOTE) Elevated high sensitivity troponin I (hsTnI) values and significant  changes across serial measurements may suggest ACS but many other  chronic and acute conditions are known to elevate hsTnI results.  Refer to the "Links" section for chest pain algorithms and additional  guidance. Performed at [facility], [address] [place], [place] [address]   Troponin I (High Sensitivity)     Status: None   Collection Time: 08/09/19 10:12 AM  Result Value Ref Range   Troponin I (High Sensitivity) 4 <18 ng/L    Comment: (NOTE) Elevated high sensitivity troponin I (hsTnI) values and significant  changes across serial measurements may suggest ACS but many other  chronic and acute conditions are known to elevate hsTnI results.  Refer to the "Links" section for chest pain algorithms and additional  guidance. Performed at [facility], [address] [place], [place] [address]     DG Chest 2 View  Result Date: 08/09/2019 CLINICAL DATA:  Intermittent chest pain EXAM: CHEST - 2  VIEW COMPARISON:  None. FINDINGS: Cardiac shadows within normal limits. Aortic calcifications are seen. The lungs are well aerated bilaterally. No focal infiltrate is seen. No bony abnormality is noted. IMPRESSION: No active cardiopulmonary disease. Electronically Signed   By: Alcide CleverMark  Lukens M.D.   On: 08/09/2019 02:51   CT ABDOMEN PELVIS W CONTRAST  Result Date: 08/09/2019 CLINICAL DATA:  Upper abdominal pain for 2 days, initial encounter EXAM: CT ABDOMEN AND PELVIS WITH CONTRAST TECHNIQUE: Multidetector CT imaging of the abdomen and pelvis was performed using the standard protocol following bolus administration of intravenous contrast. CONTRAST:  100mL OMNIPAQUE IOHEXOL 300 MG/ML  SOLN COMPARISON:  None. FINDINGS: Lower chest: No acute abnormality. Hepatobiliary: Liver is within normal limits. Gallbladder is well distended. Dilatation of the common bile duct is noted to 11 mm without definitive mass lesion or stone identified. Pancreas: Pancreas is within normal limits. Spleen: Normal in size without focal abnormality. Adrenals/Urinary Tract: Adrenal glands are within normal limits. Kidneys demonstrate a normal enhancement pattern bilaterally. Parapelvic cysts are noted on the left. No renal calculi or obstructive changes are seen. Bladder is partially distended. Stomach/Bowel: Scattered diverticular changes noted within the sigmoid colon. No evidence of diverticulitis. No obstructive or inflammatory changes of  the colon are seen. The appendix is within normal limits. No small bowel abnormality is noted. Stomach is within normal limits. Some fluid is noted in the distal esophagus likely related to reflux. Small hiatal hernia is seen. Vascular/Lymphatic: Aortic atherosclerosis. No enlarged abdominal or pelvic lymph nodes. Reproductive: Uterus and bilateral adnexa are unremarkable. Mild pelvic varices are noted on the left. Other: No abdominal wall hernia or abnormality. No abdominopelvic ascites. Musculoskeletal:  Degenerative changes of the lumbar spine are noted. No acute bony abnormality is noted. IMPRESSION: Gallbladder is well distended with prominence of the intrahepatic and extrahepatic biliary tree without definitive obstructive lesion. Correlation with laboratory values is recommended. Diverticulosis without diverticulitis. Chronic appearing changes as described above. Electronically Signed   By: Alcide Clever M.D.   On: 08/09/2019 03:04   US Abdomen Limited RUQ  Result Date: 08/09/2019 CLINICAL DATA:  Upper abdominal pain EXAM: ULTRASOUND ABDOMEN LIMITED RIGHT UPPER QUADRANT COMPARISON:  None. FINDINGS: Gallbladder: Within the gallbladder, there are echogenic foci which move and shadow consistent with cholelithiasis. Largest gallstone measures 8 mm in length. Gallbladder wall is slightly thickened with slight pericholecystic fluid. No sonographic Murphy sign noted by sonographer. Common bile duct: Diameter: 6 mm. No intrahepatic or extrahepatic biliary duct dilatation. Liver: No focal lesion identified. Within normal limits in parenchymal echogenicity. Portal vein is patent on color Doppler imaging with normal direction of blood flow towards the liver. Other: None. IMPRESSION: Cholelithiasis. Gallbladder wall is mildly thickened with slight pericholecystic fluid. These findings suggest potential acute cholecystitis. It may be prudent in this regard to consider nuclear medicine hepatobiliary imaging study to assess for cystic duct patency. Study otherwise unremarkable. Electronically Signed   By: Bretta Bang III M.D.   On: 08/09/2019 10:02    Review of Systems  Constitutional: Negative for chills and fever.  HENT: Negative for ear discharge, hearing loss and sore throat.   Eyes: Negative for discharge.  Respiratory: Negative for cough and shortness of breath.   Cardiovascular: Negative for chest pain and leg swelling.  Gastrointestinal: Negative for abdominal pain, constipation, diarrhea, nausea and  vomiting.  Musculoskeletal: Negative for myalgias and neck pain.  Skin: Negative for rash.  Allergic/Immunologic: Negative for environmental allergies.  Neurological: Negative for dizziness and seizures.  Hematological: Does not bruise/bleed easily.  Psychiatric/Behavioral: Negative for suicidal ideas.  All other systems reviewed and are negative.  Blood pressure 124/69, pulse 65, temperature 98.1 F (36.7 C), temperature source Oral, resp. rate 18, height 5\' 10"  (1.778 m), weight 90 kg, SpO2 94 %. Physical Exam  Constitutional: She is oriented to person, place, and time. Vital signs are normal. She appears well-developed and well-nourished.  Conversant No acute distress  HENT:  Head: Normocephalic.  Eyes: Lids are normal. No scleral icterus.  Pupils are equal round and reactive No lid lag Moist conjunctiva  Neck: No tracheal tenderness present. No thyromegaly present.  No cervical lymphadenopathy  Cardiovascular: Normal rate, regular rhythm and intact distal pulses.  No murmur heard. Respiratory: Effort normal and breath sounds normal. She has no wheezes. She has no rales.  GI: Soft. She exhibits no distension. There is no hepatosplenomegaly. There is no abdominal tenderness. There is no rebound and no guarding. No hernia.  Neurological: She is alert and oriented to person, place, and time.  Normal gait and station  Skin: Skin is warm. No rash noted. No cyanosis. Nails show no clubbing.  Normal skin turgor  Psychiatric: Judgment normal.  Appropriate affect    Assessment/Plan: 64 year old female  with subacute cholecystitis 1.  Awaiting results of MRCP.  If positive patient likely for repeat labs plus/minus ERCP 2.  Pending the above results patient likely for lap chole plus/minus IOC. 3.  Okay for liquids per surgery if MRCP negative.  Axel Filler 08/09/2019, 1:55 PM

## 2019-08-09 NOTE — ED Triage Notes (Signed)
Pt reports chest pain/epigastric pain, no SHOB or N/V, occurred two days ago but resolved on it's own. 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 Pt placed on monitor and EKG performed

## 2019-08-10 ENCOUNTER — Inpatient Hospital Stay (HOSPITAL_COMMUNITY): Admitting: Anesthesiology

## 2019-08-10 ENCOUNTER — Inpatient Hospital Stay (HOSPITAL_COMMUNITY)

## 2019-08-10 ENCOUNTER — Encounter (HOSPITAL_COMMUNITY)
Admission: EM | Disposition: A | Discharge status: Home / Self Care | Payer: Self-pay | Source: Home / Self Care | Attending: Internal Medicine

## 2019-08-10 HISTORY — PX: CHOLECYSTECTOMY: SHX55

## 2019-08-10 LAB — CBC
HCT: 35.9 % — ABNORMAL LOW (ref 36.0–46.0)
Hemoglobin: 11.7 g/dL — ABNORMAL LOW (ref 12.0–15.0)
MCH: 29.8 pg (ref 26.0–34.0)
MCHC: 32.6 g/dL (ref 30.0–36.0)
MCV: 91.6 fL (ref 80.0–100.0)
Platelets: 162 10*3/uL (ref 150–400)
RBC: 3.92 MIL/uL (ref 3.87–5.11)
RDW: 13.2 % (ref 11.5–15.5)
WBC: 5.7 10*3/uL (ref 4.0–10.5)
nRBC: 0 % (ref 0.0–0.2)

## 2019-08-10 LAB — COMPREHENSIVE METABOLIC PANEL
ALT: 309 U/L — ABNORMAL HIGH (ref 0–44)
AST: 154 U/L — ABNORMAL HIGH (ref 15–41)
Albumin: 3.3 g/dL — ABNORMAL LOW (ref 3.5–5.0)
Alkaline Phosphatase: 78 U/L (ref 38–126)
Anion gap: 4 — ABNORMAL LOW (ref 5–15)
BUN: 10 mg/dL (ref 8–23)
CO2: 27 mmol/L (ref 22–32)
Calcium: 8.3 mg/dL — ABNORMAL LOW (ref 8.9–10.3)
Chloride: 109 mmol/L (ref 98–111)
Creatinine, Ser: 0.74 mg/dL (ref 0.44–1.00)
GFR calc Af Amer: >60 mL/min (ref >60)
GFR calc non Af Amer: >60 mL/min (ref >60)
Glucose, Bld: 98 mg/dL (ref 70–99)
Potassium: 4.1 mmol/L (ref 3.5–5.1)
Sodium: 140 mmol/L (ref 135–145)
Total Bilirubin: 0.4 mg/dL (ref 0.3–1.2)
Total Protein: 5.7 g/dL — ABNORMAL LOW (ref 6.5–8.1)

## 2019-08-10 LAB — ANTI-SMOOTH MUSCLE ANTIBODY, IGG: F-Actin IgG: 7 Units (ref 0–19)

## 2019-08-10 LAB — MITOCHONDRIAL ANTIBODIES: Mitochondrial M2 Ab, IgG: <20 Units (ref 0.0–20.0)

## 2019-08-10 LAB — ANTINUCLEAR ANTIBODIES, IFA: ANA Ab, IFA: NEGATIVE

## 2019-08-10 SURGERY — LAPAROSCOPIC CHOLECYSTECTOMY WITH INTRAOPERATIVE CHOLANGIOGRAM
Anesthesia: General

## 2019-08-10 MED ORDER — ONDANSETRON HCL 4 MG/2ML IJ SOLN
INTRAMUSCULAR | Status: AC
  Filled 2019-08-10: qty 2

## 2019-08-10 MED ORDER — LIDOCAINE HCL (PF) 1 % IJ SOLN
INTRAMUSCULAR | Status: DC | PRN
  Administered 2019-08-10: 7 mL

## 2019-08-10 MED ORDER — OXYCODONE HCL 5 MG PO TABS: 5.0000 mg | ORAL_TABLET | Freq: Four times a day (QID) | ORAL | 0 refills | Status: DC | PRN

## 2019-08-10 MED ORDER — OXYCODONE HCL 5 MG PO TABS: 5.0000 mg | ORAL_TABLET | Freq: Once | ORAL | Status: DC | PRN

## 2019-08-10 MED ORDER — PROMETHAZINE HCL 25 MG/ML IJ SOLN: 6.2500 mg | INTRAMUSCULAR | Status: DC | PRN

## 2019-08-10 MED ORDER — ROCURONIUM BROMIDE 10 MG/ML (PF) SYRINGE
PREFILLED_SYRINGE | INTRAVENOUS | Status: AC
  Filled 2019-08-10: qty 10

## 2019-08-10 MED ORDER — OXYCODONE HCL 5 MG PO TABS: 5.0000 mg | ORAL_TABLET | ORAL | Status: DC | PRN

## 2019-08-10 MED ORDER — IOPAMIDOL (ISOVUE-300) INJECTION 61%
INTRAVENOUS | Status: DC | PRN
  Administered 2019-08-10: 8 mL

## 2019-08-10 MED ORDER — EPHEDRINE 5 MG/ML INJ
INTRAVENOUS | Status: AC
  Filled 2019-08-10: qty 10

## 2019-08-10 MED ORDER — OXYCODONE HCL 5 MG/5ML PO SOLN: 5.0000 mg | Freq: Once | ORAL | Status: DC | PRN

## 2019-08-10 MED ORDER — SODIUM CHLORIDE 0.9 % IV SOLN: INTRAVENOUS | Status: DC

## 2019-08-10 MED ORDER — ONDANSETRON HCL 4 MG/2ML IJ SOLN: 4.0000 mg | Freq: Once | INTRAMUSCULAR | Status: DC | PRN

## 2019-08-10 MED ORDER — METHOCARBAMOL 500 MG PO TABS: 500.0000 mg | ORAL_TABLET | Freq: Four times a day (QID) | ORAL | Status: DC | PRN

## 2019-08-10 MED ORDER — PROPOFOL 10 MG/ML IV BOLUS
INTRAVENOUS | Status: AC
  Filled 2019-08-10: qty 20

## 2019-08-10 MED ORDER — FENTANYL CITRATE (PF) 100 MCG/2ML IJ SOLN
INTRAMUSCULAR | Status: DC | PRN
  Administered 2019-08-10 (×2): 50 ug via INTRAVENOUS
  Administered 2019-08-10: 100 ug via INTRAVENOUS
  Administered 2019-08-10: 50 ug via INTRAVENOUS

## 2019-08-10 MED ORDER — ONDANSETRON HCL 4 MG/2ML IJ SOLN
INTRAMUSCULAR | Status: DC | PRN
  Administered 2019-08-10: 4 mg via INTRAVENOUS

## 2019-08-10 MED ORDER — ROSUVASTATIN CALCIUM 20 MG PO TABS: 20.0000 mg | ORAL_TABLET | Freq: Every day | ORAL | Status: DC

## 2019-08-10 MED ORDER — ACETAMINOPHEN 500 MG PO TABS
1000.0000 mg | ORAL_TABLET | ORAL | Status: AC
  Administered 2019-08-10: 1000 mg via ORAL
  Filled 2019-08-10: qty 2

## 2019-08-10 MED ORDER — ACETAMINOPHEN 325 MG PO TABS: 325.0000 mg | ORAL_TABLET | ORAL | Status: DC | PRN

## 2019-08-10 MED ORDER — MEPERIDINE HCL 50 MG/ML IJ SOLN: 6.2500 mg | INTRAMUSCULAR | Status: DC | PRN

## 2019-08-10 MED ORDER — BUPIVACAINE-EPINEPHRINE (PF) 0.25% -1:200000 IJ SOLN
INTRAMUSCULAR | Status: DC | PRN
  Administered 2019-08-10: 7 mL

## 2019-08-10 MED ORDER — MIDAZOLAM HCL 5 MG/5ML IJ SOLN
INTRAMUSCULAR | Status: DC | PRN
  Administered 2019-08-10: 2 mg via INTRAVENOUS

## 2019-08-10 MED ORDER — ACETAMINOPHEN 160 MG/5ML PO SOLN: 325.0000 mg | ORAL | Status: DC | PRN

## 2019-08-10 MED ORDER — MIDAZOLAM HCL 2 MG/2ML IJ SOLN
INTRAMUSCULAR | Status: AC
  Filled 2019-08-10: qty 2

## 2019-08-10 MED ORDER — CHLORHEXIDINE GLUCONATE CLOTH 2 % EX PADS
6.0000 | MEDICATED_PAD | Freq: Once | CUTANEOUS | Status: AC
  Administered 2019-08-10: 6 via TOPICAL

## 2019-08-10 MED ORDER — DEXAMETHASONE SODIUM PHOSPHATE 10 MG/ML IJ SOLN
INTRAMUSCULAR | Status: DC | PRN
  Administered 2019-08-10: 7 mg via INTRAVENOUS

## 2019-08-10 MED ORDER — GABAPENTIN 300 MG PO CAPS
300.0000 mg | ORAL_CAPSULE | ORAL | Status: AC
  Administered 2019-08-10: 300 mg via ORAL
  Filled 2019-08-10: qty 1

## 2019-08-10 MED ORDER — LACTATED RINGERS IR SOLN
Status: DC | PRN
  Administered 2019-08-10: 1000 mL

## 2019-08-10 MED ORDER — ROCURONIUM BROMIDE 50 MG/5ML IV SOSY
PREFILLED_SYRINGE | INTRAVENOUS | Status: DC | PRN
  Administered 2019-08-10: 60 mg via INTRAVENOUS

## 2019-08-10 MED ORDER — SUGAMMADEX SODIUM 200 MG/2ML IV SOLN
INTRAVENOUS | Status: DC | PRN
  Administered 2019-08-10: 180 mg via INTRAVENOUS

## 2019-08-10 MED ORDER — BUPIVACAINE HCL 0.25 % IJ SOLN
INTRAMUSCULAR | Status: AC
  Filled 2019-08-10: qty 1

## 2019-08-10 MED ORDER — ACETAMINOPHEN 325 MG PO TABS: 650.0000 mg | ORAL_TABLET | Freq: Four times a day (QID) | ORAL | Status: DC | PRN

## 2019-08-10 MED ORDER — FENTANYL CITRATE (PF) 100 MCG/2ML IJ SOLN: 25.0000 ug | INTRAMUSCULAR | Status: DC | PRN

## 2019-08-10 MED ORDER — CELECOXIB 200 MG PO CAPS
200.0000 mg | ORAL_CAPSULE | ORAL | Status: AC
  Administered 2019-08-10: 200 mg via ORAL
  Filled 2019-08-10: qty 1

## 2019-08-10 MED ORDER — PROPOFOL 10 MG/ML IV BOLUS
INTRAVENOUS | Status: DC | PRN
  Administered 2019-08-10: 140 mg via INTRAVENOUS

## 2019-08-10 MED ORDER — EPHEDRINE SULFATE-NACL 50-0.9 MG/10ML-% IV SOSY
PREFILLED_SYRINGE | INTRAVENOUS | Status: DC | PRN
  Administered 2019-08-10: 10 mg via INTRAVENOUS

## 2019-08-10 MED ORDER — LIDOCAINE 2% (20 MG/ML) 5 ML SYRINGE
INTRAMUSCULAR | Status: DC | PRN
  Administered 2019-08-10: 80 mg via INTRAVENOUS

## 2019-08-10 MED ORDER — MORPHINE SULFATE (PF) 2 MG/ML IV SOLN: 2.0000 mg | INTRAVENOUS | Status: DC | PRN

## 2019-08-10 MED ORDER — LIDOCAINE-EPINEPHRINE 1 %-1:100000 IJ SOLN
INTRAMUSCULAR | Status: AC
  Filled 2019-08-10: qty 1

## 2019-08-10 MED ORDER — FENTANYL CITRATE (PF) 250 MCG/5ML IJ SOLN
INTRAMUSCULAR | Status: AC
  Filled 2019-08-10: qty 5

## 2019-08-10 MED ORDER — LACTATED RINGERS IV SOLN: INTRAVENOUS | Status: DC

## 2019-08-10 MED ORDER — LIDOCAINE 2% (20 MG/ML) 5 ML SYRINGE
INTRAMUSCULAR | Status: AC
  Filled 2019-08-10: qty 5

## 2019-08-10 SURGICAL SUPPLY — 42 items
APPLIER CLIP ROT 10 11.4 M/L (STAPLE) ×4
CABLE HIGH FREQUENCY MONO STRZ (ELECTRODE) ×2 IMPLANT
CHLORAPREP W/TINT 26 (MISCELLANEOUS) ×2 IMPLANT
CLIP APPLIE ROT 10 11.4 M/L (STAPLE) ×2 IMPLANT
CLIP VESOLOCK MED LG 6/CT (CLIP) IMPLANT
COVER MAYO STAND STRL (DRAPES) IMPLANT
COVER SURGICAL LIGHT HANDLE (MISCELLANEOUS) ×2 IMPLANT
COVER WAND RF STERILE (DRAPES) IMPLANT
DECANTER SPIKE VIAL GLASS SM (MISCELLANEOUS) ×2 IMPLANT
DERMABOND ADVANCED (GAUZE/BANDAGES/DRESSINGS) ×1
DERMABOND ADVANCED .7 DNX12 (GAUZE/BANDAGES/DRESSINGS) ×1 IMPLANT
DRAPE C-ARM 42X120 X-RAY (DRAPES) IMPLANT
ELECT L-HOOK LAP 45CM DISP (ELECTROSURGICAL)
ELECT REM PT RETURN 15FT ADLT (MISCELLANEOUS) ×2 IMPLANT
ELECTRODE L-HOOK LAP 45CM DISP (ELECTROSURGICAL) IMPLANT
GLOVE BIO SURGEON STRL SZ 6 (GLOVE) ×2 IMPLANT
GLOVE INDICATOR 6.5 STRL GRN (GLOVE) ×2 IMPLANT
GOWN STRL REUS W/TWL 2XL LVL3 (GOWN DISPOSABLE) ×2 IMPLANT
GOWN STRL REUS W/TWL XL LVL3 (GOWN DISPOSABLE) ×4 IMPLANT
HEMOSTAT SNOW SURGICEL 2X4 (HEMOSTASIS) IMPLANT
KIT BASIN OR (CUSTOM PROCEDURE TRAY) ×2 IMPLANT
KIT TURNOVER KIT A (KITS) IMPLANT
L-HOOK LAP DISP 36CM (ELECTROSURGICAL) ×2
LHOOK LAP DISP 36CM (ELECTROSURGICAL) ×1 IMPLANT
PENCIL SMOKE EVACUATOR (MISCELLANEOUS) IMPLANT
POUCH RETRIEVAL ECOSAC 10 (ENDOMECHANICALS) ×1 IMPLANT
POUCH RETRIEVAL ECOSAC 10MM (ENDOMECHANICALS) ×1
POUCH SPECIMEN RETRIEVAL 10MM (ENDOMECHANICALS) ×2 IMPLANT
PROTECTOR NERVE ULNAR (MISCELLANEOUS) IMPLANT
SCISSORS LAP 5X35 DISP (ENDOMECHANICALS) ×2 IMPLANT
SET CHOLANGIOGRAPH MIX (MISCELLANEOUS) IMPLANT
SET IRRIG TUBING LAPAROSCOPIC (IRRIGATION / IRRIGATOR) ×2 IMPLANT
SET TUBE SMOKE EVAC HIGH FLOW (TUBING) IMPLANT
SLEEVE XCEL OPT CAN 5 100 (ENDOMECHANICALS) ×2 IMPLANT
SUT MNCRL AB 4-0 PS2 18 (SUTURE) ×2 IMPLANT
TAPE CLOTH 4X10 WHT NS (GAUZE/BANDAGES/DRESSINGS) IMPLANT
TOWEL OR 17X26 10 PK STRL BLUE (TOWEL DISPOSABLE) ×2 IMPLANT
TOWEL OR NON WOVEN STRL DISP B (DISPOSABLE) ×2 IMPLANT
TRAY LAPAROSCOPIC (CUSTOM PROCEDURE TRAY) ×2 IMPLANT
TROCAR BLADELESS OPT 5 100 (ENDOMECHANICALS) ×2 IMPLANT
TROCAR XCEL BLUNT TIP 100MML (ENDOMECHANICALS) ×2 IMPLANT
TROCAR XCEL NON-BLD 11X100MML (ENDOMECHANICALS) ×2 IMPLANT

## 2019-08-10 NOTE — Transfer of Care (Signed)
Immediate Anesthesia Transfer of Care Note  Patient: Shanon Payor  Procedure(s) Performed: LAPAROSCOPIC CHOLECYSTECTOMY WITH  INTRAOPERATIVE CHOLANGIOGRAM (N/A )  Patient Location: PACU  Anesthesia Type:General   Level of Consciousness: awake and patient cooperative  Airway & Oxygen Therapy: Patient Spontanous Breathing and Patient connected to face mask oxygen  Post-op Assessment: Report given to RN and Post -op Vital signs reviewed and stable  Post vital signs: Reviewed and stable  Last Vitals:  Vitals Value Taken Time  BP 138/64 08/10/19 1640  Temp    Pulse 89 08/10/19 1643  Resp 14 08/10/19 1643  SpO2 100 % 08/10/19 1643  Vitals shown include unvalidated device data.  Last Pain:  Vitals:   08/10/19 1341  TempSrc: Oral  PainSc:          Complications: No apparent anesthesia complications

## 2019-08-10 NOTE — Progress Notes (Signed)
CC:  Abdominal pain  Subjective: Pt presented with chest pain and abdominal pain worse after eating greasy food. She has had 2 prior episodes.  This AM she is pain free.  He son is coming up from [place] this AM.    Objective: Vital signs in last 24 hours: Temp:  [97.4 F (36.3 C)-98.4 F (36.9 C)] 98.1 F (36.7 C) (04/05 0605) Pulse Rate:  [65-71] 68 (04/05 0605) Resp:  [16-19] 16 (04/05 0605) BP: (124-139)/(69-77) 137/73 (04/05 0605) SpO2:  [94 %-98 %] 97 % (04/05 0605)   I/O= voided x 2 Afebrile, VSS Creatinine 1.16>>0.74 AST 223>>154 ALT169>>309 T Bil 0.4 >>0.4 WBC 10.1>>5.3>>5.7 CT 4/4: GB distended with prominence of the intrahepatic and extrahepatic biliary tree without definitive obstructive lesion. [place]:  Cholelithiasis. Gallbladder wall is mildly thickened with slight pericholecystic fluid. These findings suggest potential acute cholecystitis.  MRI 4/4:  Mild intrahepatic and extrahepatic biliary dilatation but no obvious cause. No definite common bile duct stones, ampullary lesion or pancreatic head mass. Possible recently passed common bile duct stone.  Cholelithiasis based on ultrasound but no MR findings for acute cholecystitis.  Intake/Output from previous day: No intake/output data recorded. Intake/Output this shift: No intake/output data recorded.  General appearance: alert, cooperative and no distress Resp: clear to auscultation bilaterally GI: soft, non-tender; bowel sounds normal; no masses,  no organomegaly  Lab Results:  Recent Labs    08/09/19 0802 08/10/19 0543  WBC 5.3 5.7  HGB 12.3 11.7*  HCT 37.9 35.9*  PLT 187 162    BMET Recent Labs    08/09/19 0148 08/09/19 0148 08/09/19 0802 08/10/19 0543  NA 138  --   --  140  K 3.9  --   --  4.1  CL 101  --   --  109  CO2 26  --   --  27  GLUCOSE 106*  --   --  98  BUN 23  --   --  10  CREATININE 1.16*   < > 0.84 0.74  CALCIUM 9.2  --   --  8.3*   < > = values in this interval not  displayed.   PT/INR No results for input(s): LABPROT, INR in the last 72 hours.  Recent Labs  Lab 08/09/19 0148 08/10/19 0543  AST 223* 154*  ALT 169* 309*  ALKPHOS 84 78  BILITOT 0.4 0.4  PROT 7.0 5.7*  ALBUMIN 4.1 3.3*     Lipase     Component Value Date/Time   LIPASE 41 08/09/2019 0148   Prior to Admission medications   Medication Sig Start Date End Date Taking? Authorizing Provider  aspirin EC 81 MG tablet Take 1 tablet by mouth daily. 03/24/18  Yes [provider]  Cholecalciferol (VITAMIN D) 50 MCG (2000 UT) tablet Take 2,000 Units by mouth daily.  05/12/18  Yes [provider]  lisinopril (ZESTRIL) 10 MG tablet Take 1 tablet (10 mg total) by mouth daily. 05/13/19  Yes Hilty, Lisette Abu, MD  olopatadine (PATADAY) 0.1 % ophthalmic solution Place 1 drop into both eyes 2 (two) times daily as needed for allergies.   Yes [provider]  rosuvastatin (CRESTOR) 20 MG tablet TAKE ONE TABLET BY MOUTH ONE TIME DAILY Patient taking differently: Take 20 mg by mouth daily.  06/19/19  Yes Hilty, Lisette Abu, MD   . piperacillin-tazobactam (ZOSYN)  IV 3.375 g (08/10/19 0450)     Medications: . aspirin EC  81 mg Oral Daily  .  cholecalciferol  2,000 Units Oral Daily  . heparin  5,000 Units Subcutaneous Q8H  . hydrALAZINE  25 mg Oral Q8H  . mupirocin ointment  1 application Nasal BID  . pantoprazole (PROTONIX) IV  40 mg Intravenous Q24H  . rosuvastatin  20 mg Oral Daily   . piperacillin-tazobactam (ZOSYN)  IV 3.375 g (08/10/19 0450)    Assessment/Plan AKI  - Creatinine 1.16>>0.74 Hypertension Hyperlipidemia Anomalus coronary artery (RCA),   Cholecystitis/cholelithiasis  FEN:  NPO/ No IV fluids. ID: Zosyn 4/4 >> day 2 DVT: heparin Follow up:  [facility]   Pt is pain free this AM.  We are hoping to do her surgery this AM, pending OR time.        LOS: 1 day    Thy Gullikson 08/10/2019 Please see Amion

## 2019-08-10 NOTE — Anesthesia Procedure Notes (Signed)
Procedure Name: Intubation Date/Time: 08/10/2019 3:27 PM Performed by: Montel Clock, CRNA Pre-anesthesia Checklist: Patient identified, Emergency Drugs available, Suction available, Patient being monitored and Timeout performed Patient Re-evaluated:Patient Re-evaluated prior to induction Oxygen Delivery Method: Circle system utilized Preoxygenation: Pre-oxygenation with 100% oxygen Induction Type: IV induction Ventilation: Mask ventilation without difficulty Laryngoscope Size: Mac and 3 Grade View: Grade II Tube type: Oral Tube size: 7.0 mm Number of attempts: 1 Airway Equipment and Method: Stylet Placement Confirmation: ETT inserted through vocal cords under direct vision,  positive ETCO2 and breath sounds checked- equal and bilateral Secured at: 22 cm Tube secured with: Tape Dental Injury: Teeth and Oropharynx as per pre-operative assessment

## 2019-08-10 NOTE — Anesthesia Preprocedure Evaluation (Addendum)
Anesthesia Evaluation  Patient identified by MRN, date of birth, ID band Patient awake    Reviewed: Allergy & Precautions, NPO status , Patient's Chart, lab work & pertinent test results  History of Anesthesia Complications Negative for: history of anesthetic complications  Airway Mallampati: II  TM Distance: >3 FB Neck ROM: Full    Dental no notable dental hx. (+) Caps, Dental Advisory Given, Teeth Intact   Pulmonary neg pulmonary ROS,    Pulmonary exam normal breath sounds clear to auscultation       Cardiovascular hypertension, Pt. on medications negative cardio ROS Normal cardiovascular exam Rhythm:Regular Rate:Normal     Neuro/Psych negative neurological ROS  negative psych ROS   GI/Hepatic negative GI ROS, Neg liver ROS, Acute cholecystitis   Endo/Other  negative endocrine ROS  Renal/GU negative Renal ROS  negative genitourinary   Musculoskeletal negative musculoskeletal ROS (+)   Abdominal   Peds negative pediatric ROS (+)  Hematology negative hematology ROS (+) anemia , Hgb 11.7   Anesthesia Other Findings Day of surgery medications reviewed with patient.  Reproductive/Obstetrics negative OB ROS                           Anesthesia Physical Anesthesia Plan  ASA: II  Anesthesia Plan: General   Post-op Pain Management:    Induction: Intravenous  PONV Risk Score and Plan: 4 or greater and Treatment may vary due to age or medical condition, Ondansetron, Dexamethasone and Midazolam  Airway Management Planned: Oral ETT  Additional Equipment: None  Intra-op Plan:   Post-operative Plan: Extubation in OR  Informed Consent:   Plan Discussed with: Anesthesiologist and CRNA  Anesthesia Plan Comments:        Anesthesia Quick Evaluation

## 2019-08-10 NOTE — Progress Notes (Signed)
PROGRESS NOTE  Sheila Atkinson BBC:[id number] DOB: March 10, 1956 DOA: 08/09/2019 PCP: System, Pcp Not In  HPI/Recap of past 24 hours: Sheila Atkinson is a 64 y.o. female with medical history significant of heterozygous familial hyperlipidemia (HeFH), HTN, Anomalus coronary artery (RCA), essential HTN who presents to ed with upper abdominal pain and chest pain with episode beginning hours PTA.  Elevated LFTs CT evidence of distention of gallbladder with common bile duct dilatation.  TRH was asked to admit for subacute cholecystitis.  General surgery and GI following along.  Started on IV antibiotics empirically.  MRCP showed CBD dilatation.  Possible lap chole on 08/10/2019.  08/10/19: Seen and examined.  NPO.  Symptomatology improving.  Possible lap chole today.  Assessment/Plan: Active Problems:   Gallstones   Chest pain   Severe abdominal pain with findings concerning for acute cholecystitis with dilated common bile duct on CT and MRCP Presented with upper abdominal pain with onset hours prior to admission.  Elevated LFTs CT evidence of distention of gallbladder with common bile duct dilatation and without definitive mass lesion or stone identified.  Evidence of Common bile duct dilation on MRCP. On zosyn empirically Continue IV fluid, IV antiemetics, and pain management IOC recommended Possible lap chole today. Appreciate specialists assistance  Abnormal LFTs, suspect secondary to acute cholecystitis Avoid hepatotoxic agents Trend and repeat CMP in the morning  Recent AKI likely prerenal in the setting of dehydration from poor oral intake Baseline creatinine 0.7 with GFR greater than 60 Presented with creatinine of 1.1 with GFR 50 Continue IV fluid hydration Continue to avoid nephrotoxins, hypotension and dehydration Repeat BMP in the morning  Resolved chest discomfort Troponin S negative No evidence of acute ischemia on EKG  Essential hypertension Blood pressure  stable Lisinopril is on hold due to recent AKI Continue to monitor vital signs  Hyperlipidemia Statin on hold due to elevated transaminases Repeat CMP in the morning   DVT prophylaxis: Heparin  Code Status:FUll Family Communication:  Updated sister at bedside.  Disposition Plan:  Patient is from home.  Anticipate discharge to home in the next 48 to 72 hours or when general surgery and GI have signed off.  Barrier to discharge: Possible surgery, lap chole today.  Consults called: Jarold Song Dr Derrell Lolling surgery    Objective: Vitals:   08/09/19 1448 08/09/19 1841 08/09/19 2103 08/10/19 0605  BP: 124/75 139/77 131/70 137/73  Pulse: 71 68 67 68  Resp: 19 18 17 16   Temp: (!) 97.4 F (36.3 C) 97.8 F (36.6 C) 98.4 F (36.9 C) 98.1 F (36.7 C)  TempSrc: Oral Oral Oral Oral  SpO2: 98% 96% 97% 97%  Weight:      Height:       No intake or output data in the 24 hours ending 08/10/19 1257 Filed Weights   08/09/19 0139 08/09/19 0639  Weight: 85.3 kg 90 kg    Exam:  . General: 64 y.o. year-old female well developed well nourished in no acute distress.  Alert and oriented x3. . Cardiovascular: Regular rate and rhythm with no rubs or gallops.  No thyromegaly or JVD noted.   64 Respiratory: Clear to auscultation with no wheezes or rales. Good inspiratory effort. . Abdomen: Soft nontender nondistended with normal bowel sounds x4 quadrants. . Musculoskeletal: No lower extremity edema. 2/4 pulses in all 4 extremities. Marland Kitchen Psychiatry: Mood is appropriate for condition and setting   Data Reviewed: CBC: Recent Labs  Lab 08/09/19 0148 08/09/19 0802 08/10/19 0543  WBC  10.1 5.3 5.7  NEUTROABS 7.4  --   --   HGB 13.2 12.3 11.7*  HCT 40.8 37.9 35.9*  MCV 90.7 90.9 91.6  PLT 202 187 162   Basic Metabolic Panel: Recent Labs  Lab 08/09/19 0148 08/09/19 0802 08/10/19 0543  NA 138  --  140  K 3.9  --  4.1  CL 101  --  109  CO2 26  --  27  GLUCOSE 106*  --  98  BUN 23  --  10   CREATININE 1.16* 0.84 0.74  CALCIUM 9.2  --  8.3*   GFR: Estimated Creatinine Clearance: 87.6 mL/min (by C-G formula based on SCr of 0.74 mg/dL). Liver Function Tests: Recent Labs  Lab 08/09/19 0148 08/10/19 0543  AST 223* 154*  ALT 169* 309*  ALKPHOS 84 78  BILITOT 0.4 0.4  PROT 7.0 5.7*  ALBUMIN 4.1 3.3*   Recent Labs  Lab 08/09/19 0148  LIPASE 41   No results for input(s): AMMONIA in the last 168 hours. Coagulation Profile: No results for input(s): INR, PROTIME in the last 168 hours. Cardiac Enzymes: No results for input(s): CKTOTAL, CKMB, CKMBINDEX, TROPONINI in the last 168 hours. BNP (last 3 results) No results for input(s): PROBNP in the last 8760 hours. HbA1C: No results for input(s): HGBA1C in the last 72 hours. CBG: No results for input(s): GLUCAP in the last 168 hours. Lipid Profile: No results for input(s): CHOL, HDL, LDLCALC, TRIG, CHOLHDL, LDLDIRECT in the last 72 hours. Thyroid Function Tests: No results for input(s): TSH, T4TOTAL, FREET4, T3FREE, THYROIDAB in the last 72 hours. Anemia Panel: No results for input(s): VITAMINB12, FOLATE, FERRITIN, TIBC, IRON, RETICCTPCT in the last 72 hours. Urine analysis:    Component Value Date/Time   COLORURINE YELLOW 08/09/2019 0240   APPEARANCEUR CLEAR 08/09/2019 0240   LABSPEC 1.010 08/09/2019 0240   PHURINE 6.5 08/09/2019 0240   GLUCOSEU NEGATIVE 08/09/2019 0240   HGBUR NEGATIVE 08/09/2019 0240   BILIRUBINUR NEGATIVE 08/09/2019 0240   KETONESUR NEGATIVE 08/09/2019 0240   PROTEINUR NEGATIVE 08/09/2019 0240   NITRITE NEGATIVE 08/09/2019 0240   LEUKOCYTESUR SMALL (A) 08/09/2019 0240   Sepsis Labs: @LABRCNTIP (procalcitonin:4,lacticidven:4)  ) Recent Results (from the past 240 hour(s))  SARS CORONAVIRUS 2 (TAT 6-24 HRS) Nasopharyngeal Nasopharyngeal Swab     Status: None   Collection Time: 08/09/19  4:37 AM   Specimen: Nasopharyngeal Swab  Result Value Ref Range Status   SARS Coronavirus 2 NEGATIVE  NEGATIVE Final    Comment: (NOTE) SARS-CoV-2 target nucleic acids are NOT DETECTED. The SARS-CoV-2 RNA is generally detectable in upper and lower respiratory specimens during the acute phase of infection. Negative results do not preclude SARS-CoV-2 infection, do not rule out co-infections with other pathogens, and should not be used as the sole basis for treatment or other patient management decisions. Negative results must be combined with clinical observations, patient history, and epidemiological information. The expected result is Negative. Fact Sheet for Patients: 10/09/19 Fact Sheet for Healthcare Providers: HairSlick.no This test is not yet approved or cleared by the quierodirigir.com FDA and  has been authorized for detection and/or diagnosis of SARS-CoV-2 by FDA under an Emergency Use Authorization (EUA). This EUA will remain  in effect (meaning this test can be used) for the duration of the COVID-19 declaration under Section 56 4(b)(1) of the Act, 21 U.S.C. section 360bbb-3(b)(1), unless the authorization is terminated or revoked sooner. Performed at [facility], [address] [place], [place]  Surgical PCR screen     Status: None   Collection Time: 08/09/19  9:20 PM   Specimen: Nasal Mucosa; Nasal Swab  Result Value Ref Range Status   MRSA, PCR NEGATIVE NEGATIVE Final   Staphylococcus aureus NEGATIVE NEGATIVE Final    Comment: (NOTE) The Xpert SA Assay (FDA approved for NASAL specimens in patients 34 years of age and older), is one component of a comprehensive surveillance program. It is not intended to diagnose infection nor to guide or monitor treatment. Performed at [facility], [address], [place], [place] [address]       Studies: MR 3D Recon At Scanner  Result Date: 08/09/2019 CLINICAL DATA:  Epigastric abdominal pain. EXAM: MRI ABDOMEN WITH CONTRAST (WITH  MRCP) TECHNIQUE: Multiplanar multisequence MR imaging of the abdomen was performed following the administration of intravenous contrast. Heavily T2-weighted images of the biliary and pancreatic ducts were obtained, and three-dimensional MRCP images were rendered by post processing. CONTRAST:  9mL GADAVIST GADOBUTROL 1 MMOL/ML IV SOLN COMPARISON:  Abdominal ultrasound 08/09/2019 and CT scan 08/09/2019 FINDINGS: Lower chest: The lung bases are grossly clear. No worrisome pulmonary lesions or pleural effusion. Mild cardiac enlargement but no pericardial effusion. Hepatobiliary: Mild intrahepatic biliary dilatation is noted on the CT scan. There is also moderate common bile duct dilatation measuring up to 9.5 mm. Gallbladder is also mildly distended but no pericholecystic inflammatory changes or fluid. No wall thickening or enhancement. No definite gallstones are identified but gallstones were seen on the ultrasound. No common bile duct stones are identified and the duct does taper normally to the ampulla. Findings could be due to a distal stricture or possibly from a tiny impacted stone that is not well demonstrated. No ampullary mass or pancreatic head mass is identified. Pancreas:  No mass, inflammation or ductal dilatation. Spleen:  Normal size. No focal lesions. Adrenals/Urinary Tract: The adrenal glands and kidneys are unremarkable. Left-sided parapelvic renal cysts are noted. Stomach/Bowel: The stomach, duodenum, visualized small bowel and visualized colon are unremarkable. Vascular/Lymphatic: The aorta and branch vessels are patent. The major venous structures are patent. No mesenteric or retroperitoneal mass or adenopathy. Other:  No ascites or abdominal wall hernia. Musculoskeletal: No significant bony findings. IMPRESSION: 1. Mild intrahepatic and extrahepatic biliary dilatation but no obvious cause. No definite common bile duct stones, ampullary lesion or pancreatic head mass. Possible recently passed  common bile duct stone. If symptoms persist ERCP may be indicated. 2. Cholelithiasis based on ultrasound but no MR findings for acute cholecystitis. 3. Left-sided parapelvic renal cysts. Electronically Signed   By: Rudie Meyer M.D.   On: 08/09/2019 14:10   MR ABDOMEN WITH MRCP W CONTRAST  Result Date: 08/09/2019 CLINICAL DATA:  Epigastric abdominal pain. EXAM: MRI ABDOMEN WITH CONTRAST (WITH MRCP) TECHNIQUE: Multiplanar multisequence MR imaging of the abdomen was performed following the administration of intravenous contrast. Heavily T2-weighted images of the biliary and pancreatic ducts were obtained, and three-dimensional MRCP images were rendered by post processing. CONTRAST:  64mL GADAVIST GADOBUTROL 1 MMOL/ML IV SOLN COMPARISON:  Abdominal ultrasound 08/09/2019 and CT scan 08/09/2019 FINDINGS: Lower chest: The lung bases are grossly clear. No worrisome pulmonary lesions or pleural effusion. Mild cardiac enlargement but no pericardial effusion. Hepatobiliary: Mild intrahepatic biliary dilatation is noted on the CT scan. There is also moderate common bile duct dilatation measuring up to 9.5 mm. Gallbladder is also mildly distended but no pericholecystic inflammatory changes or fluid. No wall thickening or enhancement. No definite gallstones are identified but  gallstones were seen on the ultrasound. No common bile duct stones are identified and the duct does taper normally to the ampulla. Findings could be due to a distal stricture or possibly from a tiny impacted stone that is not well demonstrated. No ampullary mass or pancreatic head mass is identified. Pancreas:  No mass, inflammation or ductal dilatation. Spleen:  Normal size. No focal lesions. Adrenals/Urinary Tract: The adrenal glands and kidneys are unremarkable. Left-sided parapelvic renal cysts are noted. Stomach/Bowel: The stomach, duodenum, visualized small bowel and visualized colon are unremarkable. Vascular/Lymphatic: The aorta and branch  vessels are patent. The major venous structures are patent. No mesenteric or retroperitoneal mass or adenopathy. Other:  No ascites or abdominal wall hernia. Musculoskeletal: No significant bony findings. IMPRESSION: 1. Mild intrahepatic and extrahepatic biliary dilatation but no obvious cause. No definite common bile duct stones, ampullary lesion or pancreatic head mass. Possible recently passed common bile duct stone. If symptoms persist ERCP may be indicated. 2. Cholelithiasis based on ultrasound but no MR findings for acute cholecystitis. 3. Left-sided parapelvic renal cysts. Electronically Signed   By: Marijo Sanes M.D.   On: 08/09/2019 14:10    Scheduled Meds: . aspirin EC  81 mg Oral Daily  . celecoxib  200 mg Oral On Call to OR  . cholecalciferol  2,000 Units Oral Daily  . heparin  5,000 Units Subcutaneous Q8H  . hydrALAZINE  25 mg Oral Q8H  . mupirocin ointment  1 application Nasal BID  . pantoprazole (PROTONIX) IV  40 mg Intravenous Q24H  . rosuvastatin  20 mg Oral Daily    Continuous Infusions: . sodium chloride 100 mL/hr at 08/10/19 1047  . piperacillin-tazobactam (ZOSYN)  IV 3.375 g (08/10/19 1240)     LOS: 1 day     Kayleen Memos, MD [facility] Pager [phone]  If 7PM-7AM, please contact night-coverage [email or web address] Password [facility] 08/10/2019, 12:57 PM

## 2019-08-10 NOTE — Progress Notes (Signed)
[facility] Gastroenterology Progress Note  Sheila Atkinson 64 y.o. 1955/11/11  CC: Abdominal pain, abnormal LFTs   Subjective: Patient seen and examined at bedside.  Abdominal pain has resolved.  MRI findings discussed with the patient.  ROS : Afebrile.  Negative for chest pain   Objective: Vital signs in last 24 hours: Vitals:   08/09/19 2103 08/10/19 0605  BP: 131/70 137/73  Pulse: 67 68  Resp: 17 16  Temp: 98.4 F (36.9 C) 98.1 F (36.7 C)  SpO2: 97% 97%    Physical Exam:  General:  Alert, cooperative, no distress, appears stated age  Head:  Normocephalic, without obvious abnormality, atraumatic  Eyes:  , EOM's intact,   Lungs:   Clear to auscultation bilaterally, respirations unlabored  Heart:  Regular rate and rhythm, S1, S2 normal  Abdomen:   Soft, non-tender, nondistended, bowel sounds present, no peritoneal signs,   Extremities: Extremities normal, atraumatic, no  edema       Lab Results: Recent Labs    08/09/19 0148 08/09/19 0148 08/09/19 0802 08/10/19 0543  NA 138  --   --  140  K 3.9  --   --  4.1  CL 101  --   --  109  CO2 26  --   --  27  GLUCOSE 106*  --   --  98  BUN 23  --   --  10  CREATININE 1.16*   < > 0.84 0.74  CALCIUM 9.2  --   --  8.3*   < > = values in this interval not displayed.   Recent Labs    08/09/19 0148 08/10/19 0543  AST 223* 154*  ALT 169* 309*  ALKPHOS 84 78  BILITOT 0.4 0.4  PROT 7.0 5.7*  ALBUMIN 4.1 3.3*   Recent Labs    08/09/19 0148 08/09/19 0148 08/09/19 0802 08/10/19 0543  WBC 10.1   < > 5.3 5.7  NEUTROABS 7.4  --   --   --   HGB 13.2   < > 12.3 11.7*  HCT 40.8   < > 37.9 35.9*  MCV 90.7   < > 90.9 91.6  PLT 202   < > 187 162   < > = values in this interval not displayed.   No results for input(s): LABPROT, INR in the last 72 hours.    Assessment/Plan: -Abdominal pain.  Imaging findings concerning for acute cholecystitis with dilated CBD.  MRI yesterday did showed dilated CBD of 9 mm without any  obstructive lesion.  T bili 0.4. -Abnormal LFTs.  Could be from cholecystitis.  Normal T bili and alkaline phosphatase.  Recommendations ------------------------ -Appreciate surgery input.  plan for laparoscopic cholecystectomy today.  Recommend IOC -If IOC negative, no further inpatient GI work-up.   -If IOC positive, she will need ERCP. -GI will follow from distance   Kathi Der MD, FACP 08/10/2019, 10:51 AM  Contact #  [phone]

## 2019-08-10 NOTE — Discharge Instructions (Addendum)
[facility]  Please arrive at least 30 min before your appointment to complete your check in paperwork.  If you are unable to arrive 30 min prior to your appointment time we may have to cancel or reschedule you. LAPAROSCOPIC SURGERY: POST OP INSTRUCTIONS Always review your discharge instruction sheet given to you by the facility where your surgery was performed. IF YOU HAVE DISABILITY OR FAMILY LEAVE FORMS, YOU MUST BRING THEM TO THE OFFICE FOR PROCESSING.   DO NOT GIVE THEM TO YOUR DOCTOR.  PAIN CONTROL  1. First take acetaminophen (Tylenol) AND/or ibuprofen (Advil) to control your pain after surgery.  Follow directions on package.  Taking acetaminophen (Tylenol) and/or ibuprofen (Advil) regularly after surgery will help to control your pain and lower the amount of prescription pain medication you may need.  You should not take more than 4,000 mg (4 grams) of acetaminophen (Tylenol) in 24 hours.  You should not take ibuprofen (Advil), aleve, motrin, naprosyn or other NSAIDS if you have a history of stomach ulcers or chronic kidney disease.  2. A prescription for pain medication may be given to you upon discharge.  Take your pain medication as prescribed, if you still have uncontrolled pain after taking acetaminophen (Tylenol) or ibuprofen (Advil). 3. Use ice packs to help control pain. 4. If you need a refill on your pain medication, please contact your pharmacy.  They will contact our office to request authorization. Prescriptions will not be filled after 5pm or on week-ends.  HOME MEDICATIONS 5. Take your usually prescribed medications unless otherwise directed.  DIET 6. You should follow a light diet the first few days after arrival home.  Be sure to include lots of fluids daily. Avoid fatty, fried foods.   CONSTIPATION 7. It is common to experience some constipation after surgery and if you are taking pain medication.  Increasing fluid intake and taking a stool  softener (such as Colace) will usually help or prevent this problem from occurring.  A mild laxative (Milk of Magnesia or Miralax) should be taken according to package instructions if there are no bowel movements after 48 hours.  WOUND/INCISION CARE 8. Most patients will experience some swelling and bruising in the area of the incisions.  Ice packs will help.  Swelling and bruising can take several days to resolve.  9. Unless discharge instructions indicate otherwise, follow guidelines below  a. STERI-STRIPS - you may remove your outer bandages 48 hours after surgery, and you may shower at that time.  You have steri-strips (small skin tapes) in place directly over the incision.  These strips should be left on the skin for 7-10 days.   b. DERMABOND/SKIN GLUE - you may shower in 24 hours.  The glue will flake off over the next 2-3 weeks. 10. Any sutures or staples will be removed at the office during your follow-up visit.  ACTIVITIES 11. You may resume regular (light) daily activities beginning the next day--such as daily self-care, walking, climbing stairs--gradually increasing activities as tolerated.  You may have sexual intercourse when it is comfortable.  Refrain from any heavy lifting or straining until approved by your doctor. a. You may drive when you are no longer taking prescription pain medication, you can comfortably wear a seatbelt, and you can safely maneuver your car and apply brakes.  FOLLOW-UP 12. You should see your doctor in the office for a follow-up appointment approximately 2-3 weeks after your surgery.  You should have been given your post-op/follow-up appointment when   your surgery was scheduled.  If you did not receive a post-op/follow-up appointment, make sure that you call for this appointment within a day or two after you arrive home to insure a convenient appointment time.  OTHER INSTRUCTIONS  WHEN TO CALL YOUR DOCTOR: 1. Fever over 101.0 2. Inability to  urinate 3. Continued bleeding from incision. 4. Increased pain, redness, or drainage from the incision. 5. Increasing abdominal pain  The clinic staff is available to answer your questions during regular business hours.  Please don't hesitate to call and ask to speak to one of the nurses for clinical concerns.  If you have a medical emergency, go to the nearest emergency room or call 911.  A surgeon from [facility] is always on call at the hospital. [address], [place], [place]  [address] ? [address] [address], [place], [place]   [address] [phone] ? [phone] ? FAX [phone]     Gallbladder Eating Plan If you have a gallbladder condition, you may have trouble digesting fats. Eating a low-fat diet can help reduce your symptoms, and may be helpful before and after having surgery to remove your gallbladder (cholecystectomy). Your health care provider may recommend that you work with a diet and nutrition specialist (dietitian) to help you reduce the amount of fat in your diet. What are tips for following this plan? General guidelines  Limit your fat intake to less than 30% of your total daily calories. If you eat around 1,800 calories each day, this is less than 60 grams (g) of fat per day.  Fat is an important part of a healthy diet. Eating a low-fat diet can make it hard to maintain a healthy body weight. Ask your dietitian how much fat, calories, and other nutrients you need each day.  Eat small, frequent meals throughout the day instead of three large meals.  Drink at least 8-10 cups of fluid a day. Drink enough fluid to keep your urine clear or pale yellow.  Limit alcohol intake to no more than 1 drink a day for nonpregnant women and 2 drinks a day for men. One drink equals 12 oz of beer, 5 oz of wine, or 1 oz of hard liquor. Reading food labels  Check Nutrition Facts on food labels for the amount of fat per serving. Choose foods with less than 3  grams of fat per serving. Shopping  Choose nonfat and low-fat healthy foods. Look for the words "nonfat," "low fat," or "fat free."  Avoid buying processed or prepackaged foods. Cooking  Cook using low-fat methods, such as baking, broiling, grilling, or boiling.  Cook with small amounts of healthy fats, such as olive oil, grapeseed oil, canola oil, or sunflower oil. What foods are recommended?   All fresh, frozen, or canned fruits and vegetables.  Whole grains.  Low-fat or non-fat (skim) milk and yogurt.  Lean meat, skinless poultry, fish, eggs, and beans.  Low-fat protein supplement powders or drinks.  Spices and herbs. What foods are not recommended?  High-fat foods. These include baked goods, fast food, fatty cuts of meat, ice cream, french toast, sweet rolls, pizza, cheese bread, foods covered with butter, creamy sauces, or cheese.  Fried foods. These include french fries, tempura, battered fish, breaded chicken, fried breads, and sweets.  Foods with strong odors.  Foods that cause bloating and gas. Summary  A low-fat diet can be helpful if you have a gallbladder condition, or before and after gallbladder surgery.  Limit your  fat intake to less than 30% of your total daily calories. This is about 60 g of fat if you eat 1,800 calories each day.  Eat small, frequent meals throughout the day instead of three large meals. This information is not intended to replace advice given to you by your health care provider. Make sure you discuss any questions you have with your health care provider. Document Revised: 08/14/2018 Document Reviewed: 05/31/2016 Elsevier Patient Education  [place].

## 2019-08-10 NOTE — Op Note (Signed)
Laparoscopic Cholecystectomy with IOC Procedure Note  Indications: This patient presents with acute calculous cholecystitis and will undergo laparoscopic cholecystectomy.  Pre-operative Diagnosis: acute calculous cholecystitis  Post-operative Diagnosis: Same  Surgeon: Stark Klein   Assistants: Will Creig Hines, PA-C  Anesthesia: General endotracheal anesthesia and local  ASA Class: 2  Procedure Details  The patient was seen again in the Holding Room. The risks, benefits, complications, treatment options, and expected outcomes were discussed with the patient. The possibilities of  bleeding, recurrent infection, damage to nearby structures, the need for additional procedures, failure to diagnose a condition, the possible need to convert to an open procedure, and creating a complication requiring transfusion or operation were discussed with the patient. The likelihood of improving the patient's symptoms with return to their baseline status is good.    The patient and/or family concurred with the proposed plan, giving informed consent. The site of surgery properly noted. The patient was taken to Operating Room, and the procedure verified as Laparoscopic Cholecystectomy with Intraoperative Cholangiogram. A Time Out was held and the above information confirmed.  Prior to the induction of general anesthesia, antibiotic prophylaxis was administered. General endotracheal anesthesia was then administered and tolerated well. After the induction, the abdomen was prepped with Chloraprep and draped in the sterile fashion. The patient was positioned in the supine position.  Local anesthetic agent was injected into the skin near the umbilicus and an incision made. We dissected down to the abdominal fascia with blunt dissection.  The fascia was incised vertically and we entered the peritoneal cavity bluntly.  A pursestring suture of 0-Vicryl was placed around the fascial opening.  The Hasson cannula was inserted  and secured with the stay suture.  Pneumoperitoneum was then created with CO2 and tolerated well without any adverse changes in the patient's vital signs. An 11-mm port was placed in the subxiphoid position.  Two 5-mm ports were placed in the right upper quadrant. All skin incisions were infiltrated with a local anesthetic agent before making the incision and placing the trocars.   We positioned the patient in reverse Trendelenburg, tilted slightly to the patient's left.  The gallbladder was identified, the fundus grasped and retracted cephalad. Adhesions were lysed bluntly and with the electrocautery where indicated, taking care not to injure any adjacent organs or viscus. The infundibulum was grasped and retracted laterally, exposing the peritoneum overlying the triangle of Calot. This was then divided and exposed in a blunt fashion. A critical view of the cystic duct and cystic artery was obtained.  The cystic duct was clearly identified and bluntly dissected circumferentially. The cystic duct was ligated with a clip distally.   An incision was made in the cystic duct and the [facility] cholangiogram catheter introduced. The catheter was secured using a clip. A cholangiogram was then performed, demonstrating a dilated common duct with some filling into duodenum and filling of left and right hepatic duct.  The duct tapered and had no filling defect.   The cystic duct was then ligated with clips and divided. The cystic artery was identified, dissected free, ligated with clips and divided as well.   The gallbladder was dissected from the liver bed in retrograde fashion with the electrocautery. The gallbladder was removed and placed in an Endocatch bag.  The gallbladder and Endocatch bag were then removed through the umbilical port site.  The liver bed was irrigated and inspected. Hemostasis was achieved with the electrocautery. Copious irrigation was utilized and was repeatedly aspirated until clear.    We  again  inspected the right upper quadrant for hemostasis.  Pneumoperitoneum was released as we removed the trocars.   The pursestring suture was used to close the umbilical fascia.  4-0 Monocryl was used to close the skin.   The skin was cleaned and dry, and Dermabond was applied. The patient was then extubated and brought to the recovery room in stable condition. Instrument, sponge, and needle counts were correct at closure and at the conclusion of the case.   Findings: Mild acute inflammation.  Dilated common duct but no filling defect, contrast in duodenum, and tapering of duct.      Estimated Blood Loss: min         Drains: none          Specimens: Gallbladder to pathology       Complications: None; patient tolerated the procedure well.         Disposition: PACU - hemodynamically stable.         Condition: stable

## 2019-08-11 ENCOUNTER — Encounter: Payer: Self-pay | Admitting: *Deleted

## 2019-08-11 LAB — CBC WITH DIFFERENTIAL/PLATELET
Abs Immature Granulocytes: 0.07 10*3/uL (ref 0.00–0.07)
Basophils Absolute: 0 10*3/uL (ref 0.0–0.1)
Basophils Relative: 0 %
Eosinophils Absolute: 0 10*3/uL (ref 0.0–0.5)
Eosinophils Relative: 0 %
HCT: 35.7 % — ABNORMAL LOW (ref 36.0–46.0)
Hemoglobin: 11.6 g/dL — ABNORMAL LOW (ref 12.0–15.0)
Immature Granulocytes: 1 %
Lymphocytes Relative: 7 %
Lymphs Abs: 1 10*3/uL (ref 0.7–4.0)
MCH: 29.7 pg (ref 26.0–34.0)
MCHC: 32.5 g/dL (ref 30.0–36.0)
MCV: 91.3 fL (ref 80.0–100.0)
Monocytes Absolute: 0.9 10*3/uL (ref 0.1–1.0)
Monocytes Relative: 7 %
Neutro Abs: 11.9 10*3/uL — ABNORMAL HIGH (ref 1.7–7.7)
Neutrophils Relative %: 85 %
Platelets: 190 10*3/uL (ref 150–400)
RBC: 3.91 MIL/uL (ref 3.87–5.11)
RDW: 13.2 % (ref 11.5–15.5)
WBC: 13.9 10*3/uL — ABNORMAL HIGH (ref 4.0–10.5)
nRBC: 0 % (ref 0.0–0.2)

## 2019-08-11 LAB — COMPREHENSIVE METABOLIC PANEL
ALT: 239 U/L — ABNORMAL HIGH (ref 0–44)
AST: 82 U/L — ABNORMAL HIGH (ref 15–41)
Albumin: 3.6 g/dL (ref 3.5–5.0)
Alkaline Phosphatase: 72 U/L (ref 38–126)
Anion gap: 7 (ref 5–15)
BUN: 9 mg/dL (ref 8–23)
CO2: 24 mmol/L (ref 22–32)
Calcium: 8.8 mg/dL — ABNORMAL LOW (ref 8.9–10.3)
Chloride: 108 mmol/L (ref 98–111)
Creatinine, Ser: 0.74 mg/dL (ref 0.44–1.00)
GFR calc Af Amer: >60 mL/min (ref >60)
GFR calc non Af Amer: >60 mL/min (ref >60)
Glucose, Bld: 133 mg/dL — ABNORMAL HIGH (ref 70–99)
Potassium: 4.1 mmol/L (ref 3.5–5.1)
Sodium: 139 mmol/L (ref 135–145)
Total Bilirubin: 0.3 mg/dL (ref 0.3–1.2)
Total Protein: 6.2 g/dL — ABNORMAL LOW (ref 6.5–8.1)

## 2019-08-11 LAB — PHOSPHORUS: Phosphorus: 3 mg/dL (ref 2.5–4.6)

## 2019-08-11 LAB — MAGNESIUM: Magnesium: 2 mg/dL (ref 1.7–2.4)

## 2019-08-11 MED ORDER — ACETAMINOPHEN 500 MG PO TABS: ORAL_TABLET | ORAL | 0 refills | Status: DC

## 2019-08-11 MED ORDER — ACETAMINOPHEN 500 MG PO TABS
1000.0000 mg | ORAL_TABLET | Freq: Three times a day (TID) | ORAL | Status: DC
  Administered 2019-08-11: 10:00:00 1000 mg via ORAL
  Filled 2019-08-11: qty 2

## 2019-08-11 MED ORDER — SIMETHICONE 80 MG PO CHEW
80.0000 mg | CHEWABLE_TABLET | Freq: Four times a day (QID) | ORAL | Status: DC
  Administered 2019-08-11: 80 mg via ORAL
  Filled 2019-08-11: qty 1

## 2019-08-11 MED ORDER — DOCUSATE SODIUM 100 MG PO CAPS: 100.0000 mg | ORAL_CAPSULE | Freq: Every day | ORAL | 0 refills | Status: AC | PRN

## 2019-08-11 MED ORDER — SIMETHICONE 80 MG PO CHEW: 80.0000 mg | CHEWABLE_TABLET | Freq: Four times a day (QID) | ORAL | 0 refills | Status: DC | PRN

## 2019-08-11 MED ORDER — IBUPROFEN 200 MG PO TABS: 600.0000 mg | ORAL_TABLET | Freq: Four times a day (QID) | ORAL | Status: DC | PRN

## 2019-08-11 MED ORDER — IBUPROFEN 200 MG PO TABS: ORAL_TABLET | ORAL | Status: AC

## 2019-08-11 NOTE — Progress Notes (Signed)
1 Day Post-Op    CC: Abdominal pain  Subjective: Still complaining of gas pain going to her shoulder this AM. Port sites all look fine. She is just getting breakfast. She has been up a couple times.  Objective: Vital signs in last 24 hours: Temp:  [97.6 F (36.4 C)-99.8 F (37.7 C)] 99.8 F (37.7 C) (04/06 0515) Pulse Rate:  [63-99] 98 (04/06 0515) Resp:  [12-24] 24 (04/06 0515) BP: (132-149)/(64-75) 143/64 (04/06 0515) SpO2:  [91 %-100 %] 91 % (04/06 0515) Weight:  [90 kg] 90 kg (04/05 1341)  nothing PO recorded 900 IV recorded No urineoutput Afebrile, VSS Labs stable  Intake/Output from previous day: 04/05 0701 - 04/06 0700 In: 900 [I.V.:900] Out: 20 [Blood:20] Intake/Output this shift: No intake/output data recorded.  General appearance: alert, cooperative and no distress Resp: clear to auscultation bilaterally GI: Soft, sore, port sites all look fine.  Lab Results:  Recent Labs    08/10/19 0543 08/11/19 0541  WBC 5.7 13.9*  HGB 11.7* 11.6*  HCT 35.9* 35.7*  PLT 162 190    BMET Recent Labs    08/10/19 0543 08/11/19 0541  NA 140 139  K 4.1 4.1  CL 109 108  CO2 27 24  GLUCOSE 98 133*  BUN 10 9  CREATININE 0.74 0.74  CALCIUM 8.3* 8.8*   PT/INR No results for input(s): LABPROT, INR in the last 72 hours.  Recent Labs  Lab 08/09/19 0148 08/10/19 0543 08/11/19 0541  AST 223* 154* 82*  ALT 169* 309* 239*  ALKPHOS 84 78 72  BILITOT 0.4 0.4 0.3  PROT 7.0 5.7* 6.2*  ALBUMIN 4.1 3.3* 3.6     Lipase     Component Value Date/Time   LIPASE 41 08/09/2019 0148     Medications: . aspirin EC  81 mg Oral Daily  . cholecalciferol  2,000 Units Oral Daily  . heparin  5,000 Units Subcutaneous Q8H  . hydrALAZINE  25 mg Oral Q8H  . mupirocin ointment  1 application Nasal BID  . pantoprazole (PROTONIX) IV  40 mg Intravenous Q24H   . sodium chloride 75 mL/hr at 08/10/19 1745    Assessment/Plan AKI  - Creatinine  1.16>>0.74 Hypertension Hyperlipidemia Anomalus coronary artery (RCA),   Cholecystitis/cholelithiasis Laparoscopic cholecystectomy with IOC, 08/10/19, Dr. Almond Lint, POD #1   FEN:   Regular diet/IV fluids ID: Zosyn 4/4 >> day 2 DVT: heparin Follow up:  [facility]  Plan: Mobilize advance her diet. She can have plain Tylenol, ibuprofen and oxycodone for pain relief. She has follow-up and discharge instructions in the AVS. She does well she can go home after lunch today.  LOS: 2 days    Tyria Springer 08/11/2019 Please see Amion

## 2019-08-11 NOTE — Discharge Summary (Addendum)
Discharge Summary  Sheila Atkinson BMW:[id number] DOB: [birth year]  PCP: System, Pcp Not In  Admit date: 08/09/2019 Discharge date: 08/11/2019  Time spent: 35 minutes   Recommendations for Outpatient Follow-up:  1. Follow-up with general surgery 2. Follow-up with GI 3. Follow-up with your PCP in 1 week 4. Take your medications as prescribed 5. Low-fat diet as recommended by general surgery. 6. Repeat CMP on 08/18/19 at your PCP's office  Discharge Diagnoses:  Active Hospital Problems   Diagnosis Date Noted  . Gallstones 08/09/2019  . Chest pain 08/09/2019    Resolved Hospital Problems  No resolved problems to display.    Discharge Condition: Stable  Diet recommendation: Low-fat diet  Vitals:   08/11/19 0110 08/11/19 0515  BP: (!) 149/73 (!) 143/64  Pulse: 99 98  Resp: 16 (!) 24  Temp: 98.6 F (37 C) 99.8 F (37.7 C)  SpO2: 92% 91%    History of present illness:  Sheila Guilbert Williamsis a 64 y.o.femalewith medical history significant ofheterozygous familial hyperlipidemia (HeFH), HTN, Anomalus coronary artery (RCA), essential HTN who presents to the ED with upper abdominal pain and chest pain with episode beginning hours prior to admission.  Elevated LFTs and CT evidence of distention of gallbladder with common bile duct dilatation.  TRH was asked to admit for acute cholecystitis.  General surgery and GI followed along.  Started on IV antibiotics empirically.  MRCP showed CBD dilatation. Post lap chole on 08/10/2019 by Dr. Donell Beers.  08/11/19:  Seen and examined.  Reports excessive gas pain.  Started on Simethicone.  Seen by general surgery and GI.  Ok to discharge per GI and general surgery.    Hospital Course:  Active Problems:   Gallstones   Chest pain  Acute calculous cholecystitis with dilated common bile duct on CT and MRCP Presented with upper abdominal pain with onset hours prior to admission.  Elevated LFTs CT evidence of distention of gallbladder with common  bile duct dilatation and without definitive mass lesion or stone identified.  Evidence of Common bile duct dilation on MRCP. RUQ abd US showed cholelithiasis Received zosyn empirically Received IV fluid, IV antiemetics, and pain management Post lap chole on 08/10/19 by Dr. Donell Beers Pain control, bowel regimen per surgery Simethicone PRN for gas pain Follow up with surgery and GI  Abnormal LFTs, suspect secondary to acute calculous cholecystitis LFTs are trending down Avoid hepatotoxic agents Follow up with GI  Resolved AKI likely prerenal in the setting of dehydration from poor oral intake Baseline creatinine 0.7 with GFR greater than 60 Presented with creatinine of 1.1 with GFR 50 Received IV fluid hydration Back to her baseline renal function Continue to avoid nephrotoxins and dehydration  Chest discomfort Troponin S negative No evidence of acute ischemia on 12 lead EKG  Essential hypertension Blood pressure stable Resume home meds Follow up with your PCP  Hyperlipidemia On Crestor, initially held due to elevated transaminases which are trending down. Repeat CMP in 1 week   Code Status:FUll  Consults called: GI and General surgery     Discharge Exam: BP (!) 143/64 (BP Location: Right Arm)   Pulse 98   Temp 99.8 F (37.7 C) (Oral)   Resp (!) 24   Ht 5\' 10"  (1.778 m)   Wt 90 kg   SpO2 91%   BMI 28.47 kg/m  . General: 64 y.o. year-old female well developed well nourished in no acute distress.  Alert and oriented x3. . Cardiovascular: Regular rate and rhythm with no rubs  or gallops.  No thyromegaly or JVD noted.   Marland Kitchen Respiratory: Clear to auscultation with no wheezes or rales. Good inspiratory effort. . Abdomen: Soft nontender nondistended with normal bowel sounds x4 quadrants. Sutures clean and dry. . Musculoskeletal: No lower extremity edema. 2/4 pulses in all 4 extremities. Marland Kitchen Psychiatry: Mood is appropriate for condition and setting  Discharge  Instructions You were cared for by a hospitalist during your hospital stay. If you have any questions about your discharge medications or the care you received while you were in the hospital after you are discharged, you can call the unit and asked to speak with the hospitalist on call if the hospitalist that took care of you is not available. Once you are discharged, your primary care physician will handle any further medical issues. Please note that NO REFILLS for any discharge medications will be authorized once you are discharged, as it is imperative that you return to your primary care physician (or establish a relationship with a primary care physician if you do not have one) for your aftercare needs so that they can reassess your need for medications and monitor your lab values.   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 You can alternate this with ibuprofen. You can buy both of these medications over-the-counter at any drugstore.   aspirin EC 81 MG tablet Take 1 tablet by mouth daily.   docusate sodium 100 MG capsule Commonly known as: Colace Take 1 capsule (100 mg total) by mouth daily as needed for up to 14 days.   ibuprofen 200 MG tablet Commonly known as: ADVIL You can take 2 to 3 tablets every 6 hours as needed for pain. You can alternate this with plain Tylenol/acetaminophen. You can buy both of these medications over-the-counter at any drugstore.   lisinopril 10 MG tablet Commonly known as: ZESTRIL Take 1 tablet (10 mg total) by mouth daily.   oxyCODONE 5 MG immediate release tablet Commonly known as: Oxy IR/ROXICODONE Take 1 tablet (5 mg total) by mouth every 6 (six) hours as needed for severe pain.    Pataday 0.1 % ophthalmic solution Generic drug: olopatadine Place 1 drop into both eyes 2 (two) times daily as needed for allergies.   rosuvastatin 20 MG tablet Commonly known as: CRESTOR TAKE ONE TABLET BY MOUTH ONE TIME DAILY   simethicone 80 MG chewable tablet Commonly known as: Gas-X Chew 1 tablet (80 mg total) by mouth 4 (four) times daily as needed for up to 7 days for flatulence.   Vitamin D 50 MCG (2000 UT) tablet Take 2,000 Units by mouth daily.      Allergies  Allergen Reactions  . 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 Erythromycin Rash   Follow-up Information    [facility], [place]. Go on 09/01/2019.   Specialty: General Surgery Why: Your appointment is 04/27 at 10 am Please arrive 30 minutes prior to your appointment to check in and fill out paperwork. Bring photo ID and insurance information. Contact information: [address] [place] [address] [phone]       Brahmbhatt,  Parag, MD. Schedule an appointment as soon as possible for a visit in 4 week(s).   Specialty: Gastroenterology Why: Follow-up for dilated CBD and abnormal LFTs.  She is status post cholecystectomy Contact information: [address] [place] [address] [phone]        [place]. Call in 1 day(s).   Why: Please call for a post hospital follow-up appointment. Contact information: [address] [place] [address] [phone]           The results of significant diagnostics from this hospitalization (including imaging, microbiology, ancillary and laboratory) are listed below for reference.    Significant Diagnostic Studies: DG Chest 2 View  Result Date: 08/09/2019 CLINICAL DATA:  Intermittent chest pain EXAM: CHEST - 2 VIEW COMPARISON:  None. FINDINGS: Cardiac shadows within normal limits. Aortic calcifications are seen. The lungs are  well aerated bilaterally. No focal infiltrate is seen. No bony abnormality is noted. IMPRESSION: No active cardiopulmonary disease. Electronically Signed   By: Inez Catalina M.D.   On: 08/09/2019 02:51   CT ABDOMEN PELVIS W CONTRAST  Result Date: 08/09/2019 CLINICAL DATA:  Upper abdominal pain for 2 days, initial encounter EXAM: CT ABDOMEN AND PELVIS WITH CONTRAST TECHNIQUE: Multidetector CT imaging of the abdomen and pelvis was performed using the standard protocol following bolus administration of intravenous contrast. CONTRAST:  159mL OMNIPAQUE IOHEXOL 300 MG/ML  SOLN COMPARISON:  None. FINDINGS: Lower chest: No acute abnormality. Hepatobiliary: Liver is within normal limits. Gallbladder is well distended. Dilatation of the common bile duct is noted to 11 mm without definitive mass lesion or stone identified. Pancreas: Pancreas is within normal limits. Spleen: Normal in size without focal abnormality. Adrenals/Urinary Tract: Adrenal glands are within normal limits. Kidneys demonstrate a normal enhancement pattern bilaterally. Parapelvic cysts are noted on the left. No renal calculi or obstructive changes are seen. Bladder is partially distended. Stomach/Bowel: Scattered diverticular changes noted within the sigmoid colon. No evidence of diverticulitis. No obstructive or inflammatory changes of the colon are seen. The appendix is within normal limits. No small bowel abnormality is noted. Stomach is within normal limits. Some fluid is noted in the distal esophagus likely related to reflux. Small hiatal hernia is seen. Vascular/Lymphatic: Aortic atherosclerosis. No enlarged abdominal or pelvic lymph nodes. Reproductive: Uterus and bilateral adnexa are unremarkable. Mild pelvic varices are noted on the left. Other: No abdominal wall hernia or abnormality. No abdominopelvic ascites. Musculoskeletal: Degenerative changes of the lumbar spine are noted. No acute bony abnormality is noted. IMPRESSION: Gallbladder is  well distended with prominence of the intrahepatic and extrahepatic biliary tree without definitive obstructive lesion. Correlation with laboratory values is recommended. Diverticulosis without diverticulitis. Chronic appearing changes as described above. Electronically Signed   By: Inez Catalina M.D.   On: 08/09/2019 03:04   MR 3D Recon At Scanner  Result Date: 08/09/2019 CLINICAL DATA:  Epigastric abdominal pain. EXAM: MRI ABDOMEN WITH CONTRAST (WITH MRCP) TECHNIQUE: Multiplanar multisequence MR imaging of the abdomen was performed following the administration of intravenous contrast. Heavily T2-weighted images of the biliary and pancreatic ducts were obtained, and three-dimensional MRCP images were rendered by post processing. CONTRAST:  54mL GADAVIST GADOBUTROL 1 MMOL/ML IV SOLN COMPARISON:  Abdominal ultrasound 08/09/2019 and CT scan 08/09/2019 FINDINGS: Lower chest: The lung bases are grossly clear. No worrisome pulmonary lesions or pleural effusion. Mild cardiac enlargement but no pericardial effusion. Hepatobiliary: Mild intrahepatic biliary dilatation is noted on the CT scan. There is also  moderate common bile duct dilatation measuring up to 9.5 mm. Gallbladder is also mildly distended but no pericholecystic inflammatory changes or fluid. No wall thickening or enhancement. No definite gallstones are identified but gallstones were seen on the ultrasound. No common bile duct stones are identified and the duct does taper normally to the ampulla. Findings could be due to a distal stricture or possibly from a tiny impacted stone that is not well demonstrated. No ampullary mass or pancreatic head mass is identified. Pancreas:  No mass, inflammation or ductal dilatation. Spleen:  Normal size. No focal lesions. Adrenals/Urinary Tract: The adrenal glands and kidneys are unremarkable. Left-sided parapelvic renal cysts are noted. Stomach/Bowel: The stomach, duodenum, visualized small bowel and visualized colon are  unremarkable. Vascular/Lymphatic: The aorta and branch vessels are patent. The major venous structures are patent. No mesenteric or retroperitoneal mass or adenopathy. Other:  No ascites or abdominal wall hernia. Musculoskeletal: No significant bony findings. IMPRESSION: 1. Mild intrahepatic and extrahepatic biliary dilatation but no obvious cause. No definite common bile duct stones, ampullary lesion or pancreatic head mass. Possible recently passed common bile duct stone. If symptoms persist ERCP may be indicated. 2. Cholelithiasis based on ultrasound but no MR findings for acute cholecystitis. 3. Left-sided parapelvic renal cysts. Electronically Signed   By: Rudie Meyer M.D.   On: 08/09/2019 14:10   DG C-Arm 1-60 Min-No Report  Result Date: 08/10/2019 Fluoroscopy was utilized by the requesting physician.  No radiographic interpretation.   MR ABDOMEN WITH MRCP W CONTRAST  Result Date: 08/09/2019 CLINICAL DATA:  Epigastric abdominal pain. EXAM: MRI ABDOMEN WITH CONTRAST (WITH MRCP) TECHNIQUE: Multiplanar multisequence MR imaging of the abdomen was performed following the administration of intravenous contrast. Heavily T2-weighted images of the biliary and pancreatic ducts were obtained, and three-dimensional MRCP images were rendered by post processing. CONTRAST:  10mL GADAVIST GADOBUTROL 1 MMOL/ML IV SOLN COMPARISON:  Abdominal ultrasound 08/09/2019 and CT scan 08/09/2019 FINDINGS: Lower chest: The lung bases are grossly clear. No worrisome pulmonary lesions or pleural effusion. Mild cardiac enlargement but no pericardial effusion. Hepatobiliary: Mild intrahepatic biliary dilatation is noted on the CT scan. There is also moderate common bile duct dilatation measuring up to 9.5 mm. Gallbladder is also mildly distended but no pericholecystic inflammatory changes or fluid. No wall thickening or enhancement. No definite gallstones are identified but gallstones were seen on the ultrasound. No common bile duct  stones are identified and the duct does taper normally to the ampulla. Findings could be due to a distal stricture or possibly from a tiny impacted stone that is not well demonstrated. No ampullary mass or pancreatic head mass is identified. Pancreas:  No mass, inflammation or ductal dilatation. Spleen:  Normal size. No focal lesions. Adrenals/Urinary Tract: The adrenal glands and kidneys are unremarkable. Left-sided parapelvic renal cysts are noted. Stomach/Bowel: The stomach, duodenum, visualized small bowel and visualized colon are unremarkable. Vascular/Lymphatic: The aorta and branch vessels are patent. The major venous structures are patent. No mesenteric or retroperitoneal mass or adenopathy. Other:  No ascites or abdominal wall hernia. Musculoskeletal: No significant bony findings. IMPRESSION: 1. Mild intrahepatic and extrahepatic biliary dilatation but no obvious cause. No definite common bile duct stones, ampullary lesion or pancreatic head mass. Possible recently passed common bile duct stone. If symptoms persist ERCP may be indicated. 2. Cholelithiasis based on ultrasound but no MR findings for acute cholecystitis. 3. Left-sided parapelvic renal cysts. Electronically Signed   By: Rudie Meyer M.D.   On: 08/09/2019 14:10  US Abdomen Limited RUQ  Result Date: 08/09/2019 CLINICAL DATA:  Upper abdominal pain EXAM: ULTRASOUND ABDOMEN LIMITED RIGHT UPPER QUADRANT COMPARISON:  None. FINDINGS: Gallbladder: Within the gallbladder, there are echogenic foci which move and shadow consistent with cholelithiasis. Largest gallstone measures 8 mm in length. Gallbladder wall is slightly thickened with slight pericholecystic fluid. No sonographic Murphy sign noted by sonographer. Common bile duct: Diameter: 6 mm. No intrahepatic or extrahepatic biliary duct dilatation. Liver: No focal lesion identified. Within normal limits in parenchymal echogenicity. Portal vein is patent on color Doppler imaging with normal  direction of blood flow towards the liver. Other: None. IMPRESSION: Cholelithiasis. Gallbladder wall is mildly thickened with slight pericholecystic fluid. These findings suggest potential acute cholecystitis. It may be prudent in this regard to consider nuclear medicine hepatobiliary imaging study to assess for cystic duct patency. Study otherwise unremarkable. Electronically Signed   By: Bretta Bang III M.D.   On: 08/09/2019 10:02    Microbiology: Recent Results (from the past 240 hour(s))  SARS CORONAVIRUS 2 (TAT 6-24 HRS) Nasopharyngeal Nasopharyngeal Swab     Status: None   Collection Time: 08/09/19  4:37 AM   Specimen: Nasopharyngeal Swab  Result Value Ref Range Status   SARS Coronavirus 2 NEGATIVE NEGATIVE Final    Comment: (NOTE) SARS-CoV-2 target nucleic acids are NOT DETECTED. The SARS-CoV-2 RNA is generally detectable in upper and lower respiratory specimens during the acute phase of infection. Negative results do not preclude SARS-CoV-2 infection, do not rule out co-infections with other pathogens, and should not be used as the sole basis for treatment or other patient management decisions. Negative results must be combined with clinical observations, patient history, and epidemiological information. The expected result is Negative. Fact Sheet for Patients: HairSlick.no Fact Sheet for Healthcare Providers: quierodirigir.com This test is not yet approved or cleared by the [place] FDA and  has been authorized for detection and/or diagnosis of SARS-CoV-2 by FDA under an Emergency Use Authorization (EUA). This EUA will remain  in effect (meaning this test can be used) for the duration of the COVID-19 declaration under Section 56 4(b)(1) of the Act, 21 U.S.C. section 360bbb-3(b)(1), unless the authorization is terminated or revoked sooner. Performed at [facility], [address], [place],  [place] [address]   Surgical PCR screen     Status: None   Collection Time: 08/09/19  9:20 PM   Specimen: Nasal Mucosa; Nasal Swab  Result Value Ref Range Status   MRSA, PCR NEGATIVE NEGATIVE Final   Staphylococcus aureus NEGATIVE NEGATIVE Final    Comment: (NOTE) The Xpert SA Assay (FDA approved for NASAL specimens in patients 72 years of age and older), is one component of a comprehensive surveillance program. It is not intended to diagnose infection nor to guide or monitor treatment. Performed at [facility], [address] [place], [place] [address]      Labs: Basic Metabolic Panel: Recent Labs  Lab 08/09/19 0148 08/09/19 0802 08/10/19 0543 08/11/19 0541  NA 138  --  140 139  K 3.9  --  4.1 4.1  CL 101  --  109 108  CO2 26  --  27 24  GLUCOSE 106*  --  98 133*  BUN 23  --  10 9  CREATININE 1.16* 0.84 0.74 0.74  CALCIUM 9.2  --  8.3* 8.8*  MG  --   --   --  2.0  PHOS  --   --   --  3.0  Liver Function Tests: Recent Labs  Lab 08/09/19 0148 08/10/19 0543 08/11/19 0541  AST 223* 154* 82*  ALT 169* 309* 239*  ALKPHOS 84 78 72  BILITOT 0.4 0.4 0.3  PROT 7.0 5.7* 6.2*  ALBUMIN 4.1 3.3* 3.6   Recent Labs  Lab 08/09/19 0148  LIPASE 41   No results for input(s): AMMONIA in the last 168 hours. CBC: Recent Labs  Lab 08/09/19 0148 08/09/19 0802 08/10/19 0543 08/11/19 0541  WBC 10.1 5.3 5.7 13.9*  NEUTROABS 7.4  --   --  11.9*  HGB 13.2 12.3 11.7* 11.6*  HCT 40.8 37.9 35.9* 35.7*  MCV 90.7 90.9 91.6 91.3  PLT 202 187 162 190   Cardiac Enzymes: No results for input(s): CKTOTAL, CKMB, CKMBINDEX, TROPONINI in the last 168 hours. BNP: BNP (last 3 results) No results for input(s): BNP in the last 8760 hours.  ProBNP (last 3 results) No results for input(s): PROBNP in the last 8760 hours.  CBG: No results for input(s): GLUCAP in the last 168 hours.     Signed:  Darlin Droparole N Josip Merolla, MD [facility] 08/11/2019, 11:25 AM

## 2019-08-11 NOTE — Progress Notes (Signed)
[facility] Gastroenterology Progress Note  Sheila Atkinson 64 y.o. 12/10/1955  CC: Abdominal pain, abnormal LFTs   Subjective: Patient seen and examined at bedside.  S/p laparoscopic cholecystectomy with negative IOC yesterday.  Complaining of some shoulder pain.  Abdominal pain resolved    Objective Vital signs in last 24 hours: Vitals:   08/11/19 0110 08/11/19 0515  BP: (!) 149/73 (!) 143/64  Pulse: 99 98  Resp: 16 (!) 24  Temp: 98.6 F (37 C) 99.8 F (37.7 C)  SpO2: 92% 91%    Physical Exam:  General:  Alert, cooperative, no distress, appears stated age  Head:  Normocephalic, without obvious abnormality, atraumatic  Eyes:  , EOM's intact,   Lungs:   Clear to auscultation bilaterally, respirations unlabored  Heart:  Regular rate and rhythm, S1, S2 normal  Abdomen:   Soft, non-tender, nondistended, bowel sounds present, no peritoneal signs,   Extremities: Extremities normal, atraumatic, no  edema       Lab Results: Recent Labs    08/10/19 0543 08/11/19 0541  NA 140 139  K 4.1 4.1  CL 109 108  CO2 27 24  GLUCOSE 98 133*  BUN 10 9  CREATININE 0.74 0.74  CALCIUM 8.3* 8.8*  MG  --  2.0  PHOS  --  3.0   Recent Labs    08/10/19 0543 08/11/19 0541  AST 154* 82*  ALT 309* 239*  ALKPHOS 78 72  BILITOT 0.4 0.3  PROT 5.7* 6.2*  ALBUMIN 3.3* 3.6   Recent Labs    08/09/19 0148 08/09/19 0802 08/10/19 0543 08/11/19 0541  WBC 10.1   < > 5.7 13.9*  NEUTROABS 7.4  --   --  11.9*  HGB 13.2   < > 11.7* 11.6*  HCT 40.8   < > 35.9* 35.7*  MCV 90.7   < > 91.6 91.3  PLT 202   < > 162 190   < > = values in this interval not displayed.   No results for input(s): LABPROT, INR in the last 72 hours.    Assessment/Plan: -Abdominal pain.  Imaging findings concerning for acute cholecystitis with dilated CBD.  MRI yesterday did showed dilated CBD of 9 mm without any obstructive lesion.  T bili 0.4. -Abnormal LFTs.  Could be from cholecystitis.  Normal T bili and  alkaline phosphatase.  Recommendations ------------------------ -IOC negative for any CBD obstruction. -LFTs improving.  No further inpatient GI work-up planned. -Recommend follow-up in GI clinic in 4 weeks after discharge for follow-up on abnormal LFTs and dilated CBD. -GI will sign off.  Call us back if needed  Kathi Der MD, FACP 08/11/2019, 10:53 AM  Contact #  [phone]

## 2019-08-11 NOTE — Progress Notes (Signed)
PT Cancellation Note  Patient Details Name: Sheila Atkinson MRN: [id number] DOB: 03/31/1956   Cancelled Treatment:    Reason Eval/Treat Not Completed: PT screened, no needs identified, will sign off  Blanchard Kelch PT Acute Rehabilitation Services Pager [phone] Office [phone]  Rada Hay 08/11/2019, 10:28 AM

## 2019-08-11 NOTE — Progress Notes (Signed)
OT Cancellation Note  Patient Details Name: Sheila Atkinson MRN: [id number] DOB: Nov 03, 1955   Cancelled Treatment:    Reason Eval/Treat Not Completed: OT screened, no needs identified, will sign off. Patient reports at baseline independent level for ADLs and mobility at this time.  No questions or concerns.  Reports will have support initially as needed. OT will sign off.   Barry Brunner, OT Acute Rehabilitation Services Pager [phone] Office [phone]    Chancy Milroy 08/11/2019, 10:17 AM

## 2019-08-12 LAB — SURGICAL PATHOLOGY

## 2019-08-12 NOTE — Anesthesia Postprocedure Evaluation (Signed)
Anesthesia Post Note  Patient: Sheila Atkinson  Procedure(s) Performed: LAPAROSCOPIC CHOLECYSTECTOMY WITH  INTRAOPERATIVE CHOLANGIOGRAM (N/A )     Patient location during evaluation: PACU Anesthesia Type: General Level of consciousness: awake and alert Pain management: pain level controlled Vital Signs Assessment: post-procedure vital signs reviewed and stable Respiratory status: spontaneous breathing, nonlabored ventilation, respiratory function stable and patient connected to nasal cannula oxygen Cardiovascular status: blood pressure returned to baseline and stable Postop Assessment: no apparent nausea or vomiting Anesthetic complications: no    Last Vitals:  Vitals:   08/11/19 0110 08/11/19 0515  BP: (!) 149/73 (!) 143/64  Pulse: 99 98  Resp: 16 (!) 24  Temp: 37 C 37.7 C  SpO2: 92% 91%    Last Pain:  Vitals:   08/11/19 0830  TempSrc:   PainSc: 0-No pain                 Kaulin Chaves S

## 2020-03-11 MED ORDER — LISINOPRIL 10 MG PO TABS: 10.0000 mg | ORAL_TABLET | Freq: Every day | ORAL | 0 refills | Status: DC

## 2020-03-11 MED ORDER — ROSUVASTATIN CALCIUM 20 MG PO TABS: 20.0000 mg | ORAL_TABLET | Freq: Every day | ORAL | 0 refills | Status: DC

## 2020-04-09 ENCOUNTER — Other Ambulatory Visit: Payer: Self-pay

## 2020-04-09 ENCOUNTER — Encounter (HOSPITAL_BASED_OUTPATIENT_CLINIC_OR_DEPARTMENT_OTHER): Payer: Self-pay | Admitting: Emergency Medicine

## 2020-04-09 ENCOUNTER — Emergency Department (HOSPITAL_BASED_OUTPATIENT_CLINIC_OR_DEPARTMENT_OTHER)
Admission: EM | Admit: 2020-04-09 | Discharge: 2020-04-09 | Disposition: A | Discharge status: Home / Self Care | Attending: Emergency Medicine | Admitting: Emergency Medicine

## 2020-04-09 DIAGNOSIS — H9201 Otalgia, right ear: Secondary | ICD-10-CM

## 2020-04-09 DIAGNOSIS — Z79899 Other long term (current) drug therapy: Secondary | ICD-10-CM | POA: Diagnosis not present

## 2020-04-09 DIAGNOSIS — R0981 Nasal congestion: Secondary | ICD-10-CM | POA: Insufficient documentation

## 2020-04-09 DIAGNOSIS — R519 Headache, unspecified: Secondary | ICD-10-CM | POA: Diagnosis not present

## 2020-04-09 DIAGNOSIS — I10 Essential (primary) hypertension: Secondary | ICD-10-CM | POA: Insufficient documentation

## 2020-04-09 DIAGNOSIS — H73891 Other specified disorders of tympanic membrane, right ear: Secondary | ICD-10-CM | POA: Insufficient documentation

## 2020-04-09 DIAGNOSIS — H6591 Unspecified nonsuppurative otitis media, right ear: Secondary | ICD-10-CM

## 2020-04-09 MED ORDER — AMOXICILLIN 500 MG PO CAPS: 500.0000 mg | ORAL_CAPSULE | Freq: Three times a day (TID) | ORAL | 0 refills | Status: DC

## 2020-04-09 NOTE — ED Provider Notes (Signed)
[facility] EMERGENCY DEPARTMENT Provider Note   CSN: [id number] Arrival date & time: 04/09/20  0349     History Chief Complaint  Patient presents with  . Otalgia    Sheila Atkinson is a 64 y.o. female.   Otalgia Location:  Right Behind ear:  No abnormality Quality:  Aching, sharp and shooting Severity:  Mild Timing:  Constant Chronicity:  New Relieved by:  None tried Worsened by:  Nothing Ineffective treatments:  None tried Associated symptoms: congestion and headaches   Associated symptoms: no fever, no rhinorrhea and no sore throat        Past Medical History:  Diagnosis Date  . Anomaly    heart structure - ischemia  . Hyperlipidemia   . Hypertension     Patient Active Problem List   Diagnosis Date Noted  . Gallstones 08/09/2019  . Chest pain 08/09/2019  . Familial hypercholesterolemia 07/05/2018  . Essential hypertension 07/05/2018    Past Surgical History:  Procedure Laterality Date  . CESAREAN SECTION  1995  . CHOLECYSTECTOMY N/A 08/10/2019   Procedure: LAPAROSCOPIC CHOLECYSTECTOMY WITH  INTRAOPERATIVE CHOLANGIOGRAM;  Surgeon: Almond Lint, MD;  Location: [facility];  Service: General;  Laterality: N/A;  . TONSILLECTOMY       OB History   No obstetric history on file.     Family History  Problem Relation Age of Onset  . Heart disease Mother   . Diabetes Mother   . Kidney failure Mother   . Heart failure Father   . Hyperlipidemia Sister   . Hypertension Sister   . Hypertension Brother   . Hyperlipidemia Brother   . Heart attack Maternal Grandmother   . Heart attack Maternal Grandfather   . Stroke Maternal Grandfather   . Heart failure Paternal Grandmother   . Heart attack Paternal Grandfather   . Hyperlipidemia Sister   . Hypertension Sister   . Hyperlipidemia Sister     Social History   Tobacco Use  . Smoking status: Never Smoker  . Smokeless tobacco: Never Used  Vaping Use  . Vaping Use: Never used  Substance Use  Topics  . Alcohol use: Yes    Comment: occassional  . Drug use: Never    Home Medications Prior to Admission medications   Medication Sig Start Date End Date Taking? Authorizing Provider  acetaminophen (TYLENOL) 500 MG tablet You can take 1000 mg of plain Tylenol/acetaminophen every 8 hours as needed for pain. You can alternate this with ibuprofen. You can buy both of these medications over-the-counter at any drugstore. 08/11/19   Sherrie George, PA-C  amoxicillin (AMOXIL) 500 MG capsule Take 1 capsule (500 mg total) by mouth 3 (three) times daily. 04/09/20   Kaden Dunkel, Barbara Cower, MD  aspirin EC 81 MG tablet Take 1 tablet by mouth daily. 03/24/18   [provider]  Cholecalciferol (VITAMIN D) 50 MCG (2000 UT) tablet Take 2,000 Units by mouth daily.  05/12/18   [provider]  ibuprofen (ADVIL) 200 MG tablet You can take 2 to 3 tablets every 6 hours as needed for pain. You can alternate this with plain Tylenol/acetaminophen. You can buy both of these medications over-the-counter at any drugstore. 08/11/19   Sherrie George, PA-C  lisinopril (ZESTRIL) 10 MG tablet Take 1 tablet (10 mg total) by mouth daily. Need appt for future refills 03/11/20   Chrystie Nose, MD  olopatadine (PATADAY) 0.1 % ophthalmic solution Place 1 drop into both eyes 2 (two) times daily as needed for allergies.  [provider]  oxyCODONE (OXY IR/ROXICODONE) 5 MG immediate release tablet Take 1 tablet (5 mg total) by mouth every 6 (six) hours as needed for severe pain. 08/10/19   Meuth, Brooke A, PA-C  rosuvastatin (CRESTOR) 20 MG tablet Take 1 tablet (20 mg total) by mouth daily. Need appt for future refills 03/11/20   Chrystie Nose, MD  simethicone (GAS-X) 80 MG chewable tablet Chew 1 tablet (80 mg total) by mouth 4 (four) times daily as needed for up to 7 days for flatulence. 08/11/19 08/18/19  Darlin Drop, DO    Allergies    Other and Erythromycin  Review of Systems   Review of Systems    Constitutional: Negative for fever.  HENT: Positive for congestion and ear pain. Negative for rhinorrhea and sore throat.   Neurological: Positive for headaches.  All other systems reviewed and are negative.   Physical Exam Updated Vital Signs BP (!) 150/73 (BP Location: Left Arm)   Pulse 88   Temp 98.1 F (36.7 C) (Oral)   Resp 16   Ht 5\' 10"  (1.778 m)   Wt 86.2 kg   SpO2 99%   BMI 27.26 kg/m   Physical Exam Vitals and nursing note reviewed.  Constitutional:      Appearance: She is well-developed.  HENT:     Head: Normocephalic and atraumatic.     Right Ear: A middle ear effusion is present.     Left Ear: Tympanic membrane and ear canal normal.  No middle ear effusion.     Ears:     Comments: Mild erythema in the external auditory canal on the right    Mouth/Throat:     Mouth: Mucous membranes are moist.     Pharynx: Oropharynx is clear.  Eyes:     Pupils: Pupils are equal, round, and reactive to light.  Cardiovascular:     Rate and Rhythm: Normal rate and regular rhythm.  Pulmonary:     Effort: No respiratory distress.     Breath sounds: No stridor.  Abdominal:     General: Abdomen is flat. There is no distension.  Musculoskeletal:        General: No swelling or tenderness. Normal range of motion.     Cervical back: Normal range of motion.  Skin:    General: Skin is warm and dry.  Neurological:     General: No focal deficit present.     Mental Status: She is alert.     ED Results / Procedures / Treatments   Labs (all labs ordered are listed, but only abnormal results are displayed) Labs Reviewed - No data to display  EKG None  Radiology No results found.  Procedures Procedures (including critical care time)  Medications Ordered in ED Medications - No data to display  ED Course  I have reviewed the triage vital signs and the nursing notes.  Pertinent labs & imaging results that were available during my care of the patient were reviewed by me  and considered in my medical decision making (see chart for details).    MDM Rules/Calculators/A&P                          Has a middle ear effusion but not out right infection.  No evidence of mastoiditis.  No rash to suggest zoster or cellulitis.  No neuro changes or severe headache to suggest intracranial mass.  No dental infection obvious.  Says that she has  neck pain but her neck pain is obviously muscular hurts worse at her sternocleidomastoid attachment points.  Worse when she moves her head.  Low suspicion for Lemierre's syndrome. Will we will try some decongestants to see if the drainage helps her ear.  If not she will start antibiotics.  Follow-up with PCP.  Return here for worsening symptoms  Final Clinical Impression(s) / ED Diagnoses Final diagnoses:  Right ear pain  Fluid level behind tympanic membrane of right ear    Rx / DC Orders ED Discharge Orders         Ordered    amoxicillin (AMOXIL) 500 MG capsule  3 times daily        04/09/20 0431           Tyrah Broers, Barbara Cower, MD 04/09/20 [phone]

## 2020-04-09 NOTE — ED Triage Notes (Signed)
Pt is c/o right ear pain x 2 days  Pt says that the right side of her neck hurts too  Pt states she was unable to sleep due to the pain

## 2020-06-06 ENCOUNTER — Ambulatory Visit: Admitting: Internal Medicine

## 2020-06-15 ENCOUNTER — Other Ambulatory Visit: Payer: Self-pay

## 2020-06-15 ENCOUNTER — Ambulatory Visit (INDEPENDENT_AMBULATORY_CARE_PROVIDER_SITE_OTHER): Admitting: Internal Medicine

## 2020-06-15 ENCOUNTER — Other Ambulatory Visit: Payer: Self-pay | Admitting: *Deleted

## 2020-06-15 VITALS — BP 108/78 | HR 77 | Ht 70.0 in | Wt 190.0 lb

## 2020-06-15 DIAGNOSIS — I10 Essential (primary) hypertension: Secondary | ICD-10-CM

## 2020-06-15 DIAGNOSIS — E7801 Familial hypercholesterolemia: Secondary | ICD-10-CM | POA: Diagnosis not present

## 2020-06-15 DIAGNOSIS — I2510 Atherosclerotic heart disease of native coronary artery without angina pectoris: Secondary | ICD-10-CM | POA: Diagnosis not present

## 2020-06-15 MED ORDER — LISINOPRIL 10 MG PO TABS: 10.0000 mg | ORAL_TABLET | Freq: Every day | ORAL | 3 refills | Status: DC

## 2020-06-15 MED ORDER — ROSUVASTATIN CALCIUM 20 MG PO TABS: 20.0000 mg | ORAL_TABLET | Freq: Every day | ORAL | 3 refills | Status: DC

## 2020-06-15 NOTE — Patient Instructions (Signed)
Medication Instructions:   NO CHANGES *If you need a refill on your cardiac medications before your next appointment, please call your pharmacy*   Lab Work: LIPID - FASTING If you have labs (blood work) drawn today and your tests are completely normal, you will receive your results only by: Marland Kitchen MyChart Message (if you have MyChart) OR . A paper copy in the mail If you have any lab test that is abnormal or we need to change your treatment, we will call you to review the results.   Testing/Procedures: NOT NEEDED   Follow-Up: At [facility], you and your health needs are our priority.  As part of our continuing mission to provide you with exceptional heart care, we have created designated Provider Care Teams.  These Care Teams include your primary Cardiologist (physician) and Advanced Practice Providers (APPs -  Physician Assistants and Nurse Practitioners) who all work together to provide you with the care you need, when you need it.     Your next appointment:   12 month(s)  The format for your next appointment:   In Person  Provider:   K. Italy Hilty, MD

## 2020-06-15 NOTE — Telephone Encounter (Signed)
PATIENT REQUESTING MEDICATION REFILLED FOR THE YEAR  MEDICATION REFILLED FOR 90 DAYS WITH 3 REFILLS -E-SENT TO PHARMACY

## 2020-06-15 NOTE — Progress Notes (Signed)
LIPID CLINIC CONSULT NOTE  Chief Complaint:  Dyslipidemia  Primary Care Physician: Pcp, No  Primary Cardiologist:  No primary care provider on file.  HPI:  Sheila Atkinson is a 65 y.o. female who is being seen today for the evaluation of dyslipidemia at the request of No ref. provider found. 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  Surprisingly, she is not currently on any treatment.  Her most recent lipid profile December 2019 showed total cholesterol 309, triglycerides 181, HDL 60 and direct LDL of 241.  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  Additionally, there is heart disease in her family, mainly in her father's side including her father who was also in the [place] and died at age 80 of heart failure in her paternal grandfather who also died of a heart attack in his 27s.  His maternal grandfather also had a stroke and heart attack and her maternal grandmother also had heart attack.  Her mother also had bypass surgery at age 28 therefore she has significant cardiovascular risk factors from both parents on either side of the family.  It is difficult to tell however who she may have inherited the abnormal genetics which to find her FH.  She does have 2 sisters who have high cholesterol and one who may or may not.  She did have an episode of chest pain back in 2009.  [facility] in [place].  She had a stress echocardiogram which demonstrated ischemic EKG changes and antero-and anteroseptal wall motion abnormalities concerning for ischemia.  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  She then subsequently underwent 64 slice  cardiac CT.  This demonstrated a total calcium score of 141 (in June 2009), a short left main that bifurcates in the LAD and circumflex and are free of disease.  The LAD which showed about a 30% mid LAD stenosis.  No significant circumflex and marginal branch disease.  The right coronary artery was noted to come off of the right coronary cusp however just adjacent to the left coronary cusp and is noted to take an intra-arterial course between the aorta and PA.  Overall, there is minimal coronary disease.  05/13/2019  Sheila Atkinson returns today for follow-up.  Overall she is doing quite well.  Although she had marked dyslipidemia with total cholesterol of 309 and direct LDL of 241, she was placed on high potency rosuvastatin 20 mg daily and had a marked improvement in her dyslipidemia.  In fact she was a "super responder".  Her total cholesterol is now 154, HDL 66, LDL 63 and triglycerides 126.  She did undergo genetic testing with Dr. 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 The only reason it is called out as VUS is that it has not yet been reported in FH patients in literature. So far only 3 clinical labs have seen this variant in their FH testing population.  As per other APO B mutations, we have seen a marked response to statins and lower overall family history of pathologic disease.  06/15/2020  Sheila Atkinson is seen today in follow-up.  Over the past year  she has done very well.  She denies any chest pain or worsening shortness of breath.  Her lipids have been well controlled on rosuvastatin in fact she was a hyper responder.  She does have FH with an APO B mutation however it was considered a variant of unknown significance.  Blood pressures well controlled.  She recently had a gallbladder attack in April 2021 which required urgent surgery however since then she is felt much better.  This caused her significant chest  pain.  PMHx:  Past Medical History:  Diagnosis Date  . Anomaly    heart structure - ischemia  . Hyperlipidemia   . Hypertension     Past Surgical History:  Procedure Laterality Date  . CESAREAN SECTION  1995  . CHOLECYSTECTOMY N/A 08/10/2019   Procedure: LAPAROSCOPIC CHOLECYSTECTOMY WITH  INTRAOPERATIVE CHOLANGIOGRAM;  Surgeon: Almond Lint, MD;  Location: [facility];  Service: General;  Laterality: N/A;  . TONSILLECTOMY      FAMHx:  Family History  Problem Relation Age of Onset  . Heart disease Mother   . Diabetes Mother   . Kidney failure Mother   . Heart failure Father   . Hyperlipidemia Sister   . Hypertension Sister   . Hypertension Brother   . Hyperlipidemia Brother   . Heart attack Maternal Grandmother   . Heart attack Maternal Grandfather   . Stroke Maternal Grandfather   . Heart failure Paternal Grandmother   . Heart attack Paternal Grandfather   . Hyperlipidemia Sister   . Hypertension Sister   . Hyperlipidemia Sister     SOCHx:   reports that she has never smoked. She has never used smokeless tobacco. She reports current alcohol use. She reports that she does not use drugs.  ALLERGIES:  Allergies  Allergen Reactions  . 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 Erythromycin Rash    ROS: Pertinent items noted in HPI and remainder of comprehensive ROS otherwise negative.  HOME MEDS: Current Outpatient Medications on File Prior to Visit  Medication Sig Dispense Refill  . acetaminophen (TYLENOL) 500 MG tablet You can take 1000 mg of plain Tylenol/acetaminophen every 8 hours as needed for pain. You can alternate this with ibuprofen. You can buy both of these medications over-the-counter at any drugstore. 30 tablet 0  . aspirin EC 81 MG tablet Take 1 tablet by mouth daily.    . Cholecalciferol (VITAMIN D) 50 MCG (2000 UT) tablet Take 2,000 Units by mouth daily.     Marland Kitchen ibuprofen (ADVIL) 200 MG tablet You can take 2 to 3 tablets every 6  hours as needed for pain. You can alternate this with plain Tylenol/acetaminophen. You can buy both of these medications over-the-counter at any drugstore.    Marland Kitchen lisinopril (ZESTRIL) 10 MG tablet Take 1 tablet (10 mg total) by mouth daily. Need appt for future refills 90 tablet 0  . olopatadine (PATANOL) 0.1 % ophthalmic solution Place 1 drop into both eyes 2 (two) times daily as needed for allergies.    . rosuvastatin (CRESTOR) 20 MG tablet Take 1 tablet (20 mg total) by mouth daily. Need appt for future refills 90 tablet 0   No current facility-administered medications on file prior to visit.    LABS/IMAGING: No results found for this or any previous visit (from the past 48 hour(s)). No results found.  LIPID PANEL:    Component Value Date/Time   CHOL 183 05/05/2019 0837   TRIG 137 05/05/2019 0837  HDL 58 05/05/2019 0837   CHOLHDL 3.2 05/05/2019 0837   LDLCALC 101 (H) 05/05/2019 0837    WEIGHTS: Wt Readings from Last 3 Encounters:  06/15/20 190 lb (86.2 kg)  04/09/20 190 lb (86.2 kg)  08/10/19 198 lb 6.6 oz (90 kg)    VITALS: BP 108/78 (BP Location: Left Arm, Patient Position: Sitting, Cuff Size: Normal)   Pulse 77   Ht 5\' 10"  (1.778 m)   Wt 190 lb (86.2 kg)   BMI 27.26 kg/m   EXAM: General appearance: alert and no distress Neck: no carotid bruit, no JVD and thyroid not enlarged, symmetric, no tenderness/mass/nodules Lungs: clear to auscultation bilaterally Heart: regular rate and rhythm, S1, S2 normal, no murmur, click, rub or gallop Abdomen: soft, non-tender; bowel sounds normal; no masses,  no organomegaly Extremities: extremities normal, atraumatic, no cyanosis or edema Pulses: 2+ and symmetric Skin: Skin color, texture, turgor normal. No rashes or lesions Neurologic: Grossly normal Psych: Pleasant  EKG: Normal sinus rhythm at 77, low voltage QRS-personally reviewed  ASSESSMENT: 1. Coronary artery disease-mild, nonobstructive with high anterior takeoff of the  RCA that courses between the aorta and PA but arises from the medial right coronary cusp. 2. 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 3. Strong family history of premature coronary disease 4. Hypertension 5. Statin "hyperresponder"  PLAN: 1.   Mrs. Gunkel seems to be doing well without any chest pain or worsening shortness of breath.  She had her gallbladder out earlier this year with marked improvement in her symptoms.  She continues to tolerate her statin.  She is due for repeat lipid testing.  Her blood pressure is well controlled.  Overall she is doing well.  Follow-up with me annually or sooner as necessary.  Mayford Knife, MD, [facility], FACP  [place]  [facility]  Medical Director of the Advanced Lipid Disorders &  Cardiovascular Risk Reduction Clinic Diplomate of the American Board of Clinical Lipidology Attending Cardiologist  Direct Dial: [phone]  Fax: [phone]  Website:  [phone] Verlisa Vara 06/15/2020, 9:30 AM

## 2020-06-17 LAB — LIPID PANEL
Chol/HDL Ratio: 2.9 ratio (ref 0.0–4.4)
Cholesterol, Total: 159 mg/dL (ref 100–199)
HDL: 55 mg/dL (ref >39)
LDL Chol Calc (NIH): 80 mg/dL (ref 0–99)
Triglycerides: 141 mg/dL (ref 0–149)
VLDL Cholesterol Cal: 24 mg/dL (ref 5–40)

## 2020-06-30 ENCOUNTER — Encounter: Payer: Self-pay | Admitting: Internal Medicine

## 2021-02-07 ENCOUNTER — Ambulatory Visit (HOSPITAL_BASED_OUTPATIENT_CLINIC_OR_DEPARTMENT_OTHER)
Admission: RE | Admit: 2021-02-07 | Discharge: 2021-02-07 | Disposition: A | Discharge status: Home / Self Care | Payer: Medicare Other | Source: Ambulatory Visit | Attending: Family | Admitting: Family

## 2021-02-07 ENCOUNTER — Encounter: Payer: Self-pay | Admitting: Family

## 2021-02-07 ENCOUNTER — Ambulatory Visit (INDEPENDENT_AMBULATORY_CARE_PROVIDER_SITE_OTHER): Payer: Medicare Other | Admitting: Family

## 2021-02-07 ENCOUNTER — Other Ambulatory Visit: Payer: Self-pay | Admitting: Family

## 2021-02-07 ENCOUNTER — Encounter (HOSPITAL_BASED_OUTPATIENT_CLINIC_OR_DEPARTMENT_OTHER): Payer: Self-pay

## 2021-02-07 ENCOUNTER — Other Ambulatory Visit: Payer: Self-pay

## 2021-02-07 VITALS — BP 140/90 | HR 85 | Temp 97.9°F | Ht 70.0 in

## 2021-02-07 DIAGNOSIS — Z1231 Encounter for screening mammogram for malignant neoplasm of breast: Secondary | ICD-10-CM | POA: Insufficient documentation

## 2021-02-07 DIAGNOSIS — M542 Cervicalgia: Secondary | ICD-10-CM

## 2021-02-07 DIAGNOSIS — I10 Essential (primary) hypertension: Secondary | ICD-10-CM | POA: Diagnosis not present

## 2021-02-07 DIAGNOSIS — R5383 Other fatigue: Secondary | ICD-10-CM

## 2021-02-07 DIAGNOSIS — Z1211 Encounter for screening for malignant neoplasm of colon: Secondary | ICD-10-CM

## 2021-02-07 NOTE — Patient Instructions (Signed)
Please call Dr. Blanchie Dessert office to change to cardiology in [facility] ( Dr. Dulce Sellar);

## 2021-02-07 NOTE — Progress Notes (Signed)
Sheila Atkinson is a 65 y.o. female with the following history as recorded in EpicCare:  Patient Active Problem List   Diagnosis Date Noted   Gallstones 08/09/2019   Chest pain 08/09/2019   Familial hypercholesterolemia 07/05/2018   Essential hypertension 07/05/2018    Current Outpatient Medications  Medication Sig Dispense Refill   aspirin EC 81 MG tablet Take 1 tablet by mouth daily.     Cholecalciferol (VITAMIN D) 50 MCG (2000 UT) tablet Take 2,000 Units by mouth daily.      ibuprofen (ADVIL) 200 MG tablet You can take 2 to 3 tablets every 6 hours as needed for pain. You can alternate this with plain Tylenol/acetaminophen. You can buy both of these medications over-the-counter at any drugstore.     lisinopril (ZESTRIL) 10 MG tablet Take 1 tablet (10 mg total) by mouth daily. 90 tablet 3   olopatadine (PATANOL) 0.1 % ophthalmic solution Place 1 drop into both eyes 2 (two) times daily as needed for allergies.     rosuvastatin (CRESTOR) 20 MG tablet Take 1 tablet (20 mg total) by mouth daily. 90 tablet 3   acetaminophen (TYLENOL) 500 MG tablet You can take 1000 mg of plain Tylenol/acetaminophen every 8 hours as needed for pain. You can alternate this with ibuprofen. You can buy both of these medications over-the-counter at any drugstore. (Patient not taking: Reported on 02/07/2021) 30 tablet 0   No current facility-administered medications for this visit.    Allergies: Other, Azithromycin, and Erythromycin  Past Medical History:  Diagnosis Date   Anomaly    heart structure - ischemia   Hyperlipidemia    Hypertension     Past Surgical History:  Procedure Laterality Date   CESAREAN SECTION  1995   CHOLECYSTECTOMY N/A 08/10/2019   Procedure: LAPAROSCOPIC CHOLECYSTECTOMY WITH  INTRAOPERATIVE CHOLANGIOGRAM;  Surgeon: Stark Klein, MD;  Location: [facility];  Service: General;  Laterality: N/A;   TONSILLECTOMY      Family History  Problem Relation Age of Onset   Heart disease Mother     Diabetes Mother    Kidney failure Mother    Heart failure Father    Hyperlipidemia Sister    Hypertension Sister    Hypertension Brother    Hyperlipidemia Brother    Heart attack Maternal Grandmother    Heart attack Maternal Grandfather    Stroke Maternal Grandfather    Heart failure Paternal Grandmother    Heart attack Paternal Grandfather    Hyperlipidemia Sister    Hypertension Sister    Hyperlipidemia Sister     Social History   Tobacco Use   Smoking status: Never   Smokeless tobacco: Never  Substance Use Topics   Alcohol use: Yes    Comment: occassional    Subjective:   Presents today as a new patient; chronic neck pain x 4-5 years; feels that area is worsening and having some numbness/ tingling radiating into right arm; + right hand dominant; + stressful job;   [facility]- [facility] at age 56- then switched to Cologuard;  Blood pressure at home is normal- does not do well with masks in office; averaging 116-120/ 80; Denies any chest pain, shortness of breath, blurred vision or headache     Objective:  Vitals:   02/07/21 0857  BP: 140/90  Pulse: 85  Temp: 97.9 F (36.6 C)  TempSrc: Oral  SpO2: 99%  Height: _0  (1.778 m)    General: Well developed, well  nourished, in no acute distress  Skin : Warm and dry.  Head: Normocephalic and atraumatic  Eyes: Sclera and conjunctiva clear; pupils round and reactive to light; extraocular movements intact  Lungs: Respirations unlabored; clear to auscultation bilaterally without wheeze, rales, rhonchi  CVS exam: normal rate and regular rhythm.  Musculoskeletal: No deformities; no active joint inflammation  Extremities: No edema, cyanosis, clubbing  Vessels: Symmetric bilaterally  Neurologic: Alert and oriented; speech intact; face symmetrical; moves all extremities well; CNII-XII intact without focal deficit   Assessment:  1. Neck pain   2. Visit for screening  mammogram   3. Primary hypertension   4. Other fatigue   5. Colon cancer screening     Plan:  Update cervical X-ray; will most likely need MRI/ referral to orthopedist; Order updated; Per patient, white coat hypertension; she will continue to monitor at home and follow up if consistently above 140/90; Update labs today; patient has no concerns for sleep apnea; Order for Cologuard updated;  She will return for fasting labs at her convenience;  Influenza immunization was not given due to patient refusal.   Return for fasting labs.  Orders Placed This Encounter  Procedures   DG Cervical Spine 2 or 3 views    Standing Status:   Future    Number of Occurrences:   1    Standing Expiration Date:   02/07/2022    Order Specific Question:   Reason for Exam (SYMPTOM  OR DIAGNOSIS REQUIRED)    Answer:   neck pain    Order Specific Question:   Preferred imaging location?    Answer:   [facility]   CBC with Differential/Platelet    Standing Status:   Future    Standing Expiration Date:   02/07/2022   Comp Met (CMET)    Standing Status:   Future    Standing Expiration Date:   02/07/2022   TSH    Standing Status:   Future    Standing Expiration Date:   02/07/2022   Cologuard    Requested Prescriptions    No prescriptions requested or ordered in this encounter

## 2021-02-08 ENCOUNTER — Telehealth: Payer: Self-pay

## 2021-02-08 ENCOUNTER — Other Ambulatory Visit (INDEPENDENT_AMBULATORY_CARE_PROVIDER_SITE_OTHER): Payer: Medicare Other

## 2021-02-08 ENCOUNTER — Encounter: Payer: Self-pay | Admitting: Family

## 2021-02-08 DIAGNOSIS — I10 Essential (primary) hypertension: Secondary | ICD-10-CM

## 2021-02-08 DIAGNOSIS — R5383 Other fatigue: Secondary | ICD-10-CM | POA: Diagnosis not present

## 2021-02-08 LAB — CBC WITH DIFFERENTIAL/PLATELET
Basophils Absolute: 0 10*3/uL (ref 0.0–0.1)
Basophils Relative: 0.9 % (ref 0.0–3.0)
Eosinophils Absolute: 0.2 10*3/uL (ref 0.0–0.7)
Eosinophils Relative: 3.1 % (ref 0.0–5.0)
HCT: 41.4 % (ref 36.0–46.0)
Hemoglobin: 13.7 g/dL (ref 12.0–15.0)
Lymphocytes Relative: 33.3 % (ref 12.0–46.0)
Lymphs Abs: 1.7 10*3/uL (ref 0.7–4.0)
MCHC: 33.1 g/dL (ref 30.0–36.0)
MCV: 87.4 fl (ref 78.0–100.0)
Monocytes Absolute: 0.4 10*3/uL (ref 0.1–1.0)
Monocytes Relative: 6.8 % (ref 3.0–12.0)
Neutro Abs: 2.9 10*3/uL (ref 1.4–7.7)
Neutrophils Relative %: 55.9 % (ref 43.0–77.0)
Platelets: 241 10*3/uL (ref 150.0–400.0)
RBC: 4.74 Mil/uL (ref 3.87–5.11)
RDW: 14.2 % (ref 11.5–15.5)
WBC: 5.2 10*3/uL (ref 4.0–10.5)

## 2021-02-08 LAB — COMPREHENSIVE METABOLIC PANEL
ALT: 17 U/L (ref 0–35)
AST: 17 U/L (ref 0–37)
Albumin: 4.3 g/dL (ref 3.5–5.2)
Alkaline Phosphatase: 49 U/L (ref 39–117)
BUN: 14 mg/dL (ref 6–23)
CO2: 28 mEq/L (ref 19–32)
Calcium: 9.3 mg/dL (ref 8.4–10.5)
Chloride: 103 mEq/L (ref 96–112)
Creatinine, Ser: 0.86 mg/dL (ref 0.40–1.20)
GFR: 71.14 mL/min (ref >60.00)
Glucose, Bld: 80 mg/dL (ref 70–99)
Potassium: 4.4 mEq/L (ref 3.5–5.1)
Sodium: 139 mEq/L (ref 135–145)
Total Bilirubin: 0.4 mg/dL (ref 0.2–1.2)
Total Protein: 6.7 g/dL (ref 6.0–8.3)

## 2021-02-08 LAB — TSH: TSH: 3.62 u[IU]/mL (ref 0.35–5.50)

## 2021-02-08 NOTE — Telephone Encounter (Signed)
Called pt to let her know Dr. Dulce Sellar stated "I would not encourage patients to switch to me in [facility] I do not think to be seeing them up there in the future." She states she will stay with Dr. Rennis Golden, "I was trying to get my doctors closer to home." Pt thanked me for calling her.

## 2021-02-09 ENCOUNTER — Other Ambulatory Visit: Payer: Self-pay | Admitting: Family

## 2021-02-09 DIAGNOSIS — M542 Cervicalgia: Secondary | ICD-10-CM

## 2021-02-18 ENCOUNTER — Other Ambulatory Visit: Payer: Self-pay

## 2021-02-18 ENCOUNTER — Ambulatory Visit (HOSPITAL_BASED_OUTPATIENT_CLINIC_OR_DEPARTMENT_OTHER)
Admission: RE | Admit: 2021-02-18 | Discharge: 2021-02-18 | Disposition: A | Discharge status: Home / Self Care | Payer: Medicare Other | Source: Ambulatory Visit | Attending: Family | Admitting: Family

## 2021-02-18 DIAGNOSIS — M542 Cervicalgia: Secondary | ICD-10-CM | POA: Insufficient documentation

## 2021-03-07 ENCOUNTER — Ambulatory Visit (HOSPITAL_BASED_OUTPATIENT_CLINIC_OR_DEPARTMENT_OTHER)

## 2021-03-08 ENCOUNTER — Ambulatory Visit: Payer: Medicare Other | Admitting: Family Medicine

## 2021-04-12 NOTE — Progress Notes (Signed)
[facility] [address] [place] [address] Phone: [phone] Subjective:   Bruce Donath, am serving as a scribe for Dr. Antoine Primas. This visit occurred during the SARS-CoV-2 public health emergency.  Safety protocols were in place, including screening questions prior to the visit, additional usage of staff PPE, and extensive cleaning of exam room while observing appropriate contact time as indicated for disinfecting solutions.   I'm seeing this patient by the request  of:  Olive Bass, FNP  CC: Neck and back pain   [id number]  04/13/2021 Sheila Atkinson is a 65 y.o. female coming in with complaint of chronic neck pain and right arm. numbness. Here to talk about how to manage this pain. Sharp pain with cervical rotation to the left at occiput. Patient likes to swim and walk for physical activity.   Also complains of L knee pain. Feels like knee is unstable when she is sleeping. Feels a "slipping out" sensation. No pain in knee.   Mri IMPRESSION: Mild cervical spine degeneration with up to moderate foraminal narrowing on the left at C5-6.   Diffusely patent spinal canal Xray (-)     Past Medical History:  Diagnosis Date   Anomaly    heart structure - ischemia   Hyperlipidemia    Hypertension    Past Surgical History:  Procedure Laterality Date   CESAREAN SECTION  1995   CHOLECYSTECTOMY N/A 08/10/2019   Procedure: LAPAROSCOPIC CHOLECYSTECTOMY WITH  INTRAOPERATIVE CHOLANGIOGRAM;  Surgeon: Almond Lint, MD;  Location: [facility];  Service: General;  Laterality: N/A;   TONSILLECTOMY     Social History   Socioeconomic History   Marital status: Divorced    Spouse name: Not on file   Number of children: Not on file   Years of education: Not on file   Highest education level: Not on file  Occupational History   Not on file  Tobacco Use   Smoking status: Never   Smokeless tobacco: Never  Vaping Use   Vaping Use: Never  used  Substance and Sexual Activity   Alcohol use: Yes    Comment: occassional   Drug use: Never   Sexual activity: Not on file  Other Topics Concern   Not on file  Social History Narrative   Not on file   Social Determinants of Health   Financial Resource Strain: Not on file  Food Insecurity: Not on file  Transportation Needs: Not on file  Physical Activity: Not on file  Stress: Not on file  Social Connections: Not on file   Allergies  Allergen Reactions   Other     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   Azithromycin Rash    little spotty rash on back   Erythromycin Rash   Family History  Problem Relation Age of Onset   Heart disease Mother    Diabetes Mother    Kidney failure Mother    Heart failure Father    Hyperlipidemia Sister    Hypertension Sister    Hypertension Brother    Hyperlipidemia Brother    Heart attack Maternal Grandmother    Heart attack Maternal Grandfather    Stroke Maternal Grandfather    Heart failure Paternal Grandmother    Heart attack Paternal Grandfather    Hyperlipidemia Sister    Hypertension Sister    Hyperlipidemia Sister      Current Outpatient Medications (Cardiovascular):    lisinopril (ZESTRIL) 10 MG tablet, Take 1 tablet (  10 mg total) by mouth daily.   rosuvastatin (CRESTOR) 20 MG tablet, Take 1 tablet (20 mg total) by mouth daily.   Current Outpatient Medications (Analgesics):    acetaminophen (TYLENOL) 500 MG tablet, You can take 1000 mg of plain Tylenol/acetaminophen every 8 hours as needed for pain. You can alternate this with ibuprofen. You can buy both of these medications over-the-counter at any drugstore.   aspirin EC 81 MG tablet, Take 1 tablet by mouth daily.   ibuprofen (ADVIL) 200 MG tablet, You can take 2 to 3 tablets every 6 hours as needed for pain. You can alternate this with plain Tylenol/acetaminophen. You can buy both of these medications over-the-counter at any drugstore.   Current  Outpatient Medications (Other):    Cholecalciferol (VITAMIN D) 50 MCG (2000 UT) tablet, Take 2,000 Units by mouth daily.    olopatadine (PATANOL) 0.1 % ophthalmic solution, Place 1 drop into both eyes 2 (two) times daily as needed for allergies.   Reviewed prior external information including notes and imaging from  primary care provider As well as notes that were available from care everywhere and other healthcare systems.  Past medical history, social, surgical and family history all reviewed in electronic medical record.  No pertanent information unless stated regarding to the chief complaint.   Review of Systems:  No headache, visual changes, nausea, vomiting, diarrhea, constipation, dizziness, abdominal pain, skin rash, fevers, chills, night sweats, weight loss, swollen lymph nodes, body aches, joint swelling, chest pain, shortness of breath, mood changes. POSITIVE muscle aches  Objective  Blood pressure 114/86, pulse 68, height 5\' 10"  (1.778 m), weight 190 lb (86.2 kg), SpO2 98 %.   General: No apparent distress alert and oriented x3 mood and affect normal, dressed appropriately.  HEENT: Pupils equal, extraocular movements intact  Respiratory: Patient's speak in full sentences and does not appear short of breath  Cardiovascular: No lower extremity edema, non tender, no erythema  Gait normal with good balance and coordination.  MSK:   Neck exam does have some loss lordosis.  Some tenderness to palpation of the paraspinal musculature. Patient does have some scapular dyskinesis noted right greater than left.  Negative Spurling's of the neck  Left knee exam does show some lateral tracking noted positive patellar grind test.  Patient does have trace effusion of the patellofemoral joint noted as well.  No significant instability of the knee noted.  Osteopathic findings C2 flexed rotated and side bent right C4 flexed rotated and side bent left C6 flexed rotated and side bent left T3  extended rotated and side bent right inhaled third rib T9 extended rotated and side bent left      Impression and Recommendations:     The above documentation has been reviewed and is accurate and complete , DO

## 2021-04-13 ENCOUNTER — Other Ambulatory Visit: Payer: Self-pay

## 2021-04-13 ENCOUNTER — Ambulatory Visit (INDEPENDENT_AMBULATORY_CARE_PROVIDER_SITE_OTHER): Payer: Medicare Other | Admitting: Family Medicine

## 2021-04-13 ENCOUNTER — Ambulatory Visit (INDEPENDENT_AMBULATORY_CARE_PROVIDER_SITE_OTHER): Payer: Medicare Other

## 2021-04-13 VITALS — BP 114/86 | HR 68 | Ht 70.0 in | Wt 190.0 lb

## 2021-04-13 DIAGNOSIS — M9902 Segmental and somatic dysfunction of thoracic region: Secondary | ICD-10-CM | POA: Diagnosis not present

## 2021-04-13 DIAGNOSIS — M9901 Segmental and somatic dysfunction of cervical region: Secondary | ICD-10-CM | POA: Diagnosis not present

## 2021-04-13 DIAGNOSIS — I2510 Atherosclerotic heart disease of native coronary artery without angina pectoris: Secondary | ICD-10-CM | POA: Diagnosis not present

## 2021-04-13 DIAGNOSIS — G2589 Other specified extrapyramidal and movement disorders: Secondary | ICD-10-CM | POA: Diagnosis not present

## 2021-04-13 DIAGNOSIS — M25562 Pain in left knee: Secondary | ICD-10-CM

## 2021-04-13 DIAGNOSIS — M222X2 Patellofemoral disorders, left knee: Secondary | ICD-10-CM | POA: Insufficient documentation

## 2021-04-13 DIAGNOSIS — M9908 Segmental and somatic dysfunction of rib cage: Secondary | ICD-10-CM | POA: Diagnosis not present

## 2021-04-13 NOTE — Assessment & Plan Note (Signed)
Patient does have some dyskinesis on the right side.  Discussed with patient will monitor icing regimen and home exercises, discussed with her considering watchman standpoint.  Patient did respond extremely well to osteopathic manipulation.  He did mostly muscle energy today.  Increase activity slowly.  Follow-up again in 6 to 8 weeks.

## 2021-04-13 NOTE — Patient Instructions (Signed)
Vit D 2000IU daily Tart Cherry Extract 1200mg  daily L knee xray today Tried manipulation  See me again in 6-8 weeks

## 2021-04-13 NOTE — Assessment & Plan Note (Signed)
Reviewed anatomy using anatomical model and how PFS occurs.  Given rehab exercises handout for VMO, hip abductors, core, entire kinetic chain including proprioception exercises.  Could benefit from PT, regular exercise, upright biking, and a PFS knee brace to assist with tracking abnormalities. Patient will follow up again in 6 weeks and if continuing to have difficulty consider formal physical therapy and bracing highly anticipate the patient doing well

## 2021-04-13 NOTE — Assessment & Plan Note (Signed)
   Decision today to treat with OMT was based on Physical Exam  After verbal consent patient was treated with HVLA, ME, FPR techniques in cervical, thoracic, rib,areas, all areas are chronic   Patient tolerated the procedure well with improvement in symptoms  Patient given exercises, stretches and lifestyle modifications  See medications in patient instructions if given  Patient will follow up in 4-8 weeks 

## 2021-04-20 LAB — COLOGUARD: COLOGUARD: NEGATIVE

## 2021-05-22 NOTE — Progress Notes (Signed)
[facility] [address] [place] [address] Phone: [phone] Subjective:   Bruce Donath, am serving as a scribe for Dr. Antoine Primas. This visit occurred during the SARS-CoV-2 public health emergency.  Safety protocols were in place, including screening questions prior to the visit, additional usage of staff PPE, and extensive cleaning of exam room while observing appropriate contact time as indicated for disinfecting solutions.  I'm seeing this patient by the request  of:  Olive Bass, FNP  CC: Back pain and hip pain and knee pain follow-up  [id number]  04/13/2021 Reviewed anatomy using anatomical model and how PFS occurs.   Given rehab exercises handout for VMO, hip abductors, core, entire kinetic chain including proprioception exercises.   Could benefit from PT, regular exercise, upright biking, and a PFS knee brace to assist with tracking abnormalities. Patient will follow up again in 6 weeks and if continuing to have difficulty consider formal physical therapy and bracing highly anticipate the patient doing well  Patient does have some dyskinesis on the right side.  Discussed with patient will monitor icing regimen and home exercises, discussed with her considering watchman standpoint.  Patient did respond extremely well to osteopathic manipulation.  He did mostly muscle energy today.  Increase activity slowly.  Follow-up again in 6 to 8 weeks.  Updated 05/23/2021 GRADIE BUTRICK is a 66 y.o. female coming in with complaint of left knee pain. OMT on 12/8. Has been swimming and this has helped to decrease her pain. Has been doing scapular stabilization exercises. Feels that stress is starting to cause tightness in thoracic spine. Overall much improved due to HEP. Patient also notes some soreness in R deltoid.       Past Medical History:  Diagnosis Date   Anomaly    heart structure - ischemia   Hyperlipidemia     Hypertension    Past Surgical History:  Procedure Laterality Date   CESAREAN SECTION  1995   CHOLECYSTECTOMY N/A 08/10/2019   Procedure: LAPAROSCOPIC CHOLECYSTECTOMY WITH  INTRAOPERATIVE CHOLANGIOGRAM;  Surgeon: Almond Lint, MD;  Location: [facility];  Service: General;  Laterality: N/A;   TONSILLECTOMY     Social History   Socioeconomic History   Marital status: Divorced    Spouse name: Not on file   Number of children: Not on file   Years of education: Not on file   Highest education level: Not on file  Occupational History   Not on file  Tobacco Use   Smoking status: Never   Smokeless tobacco: Never  Vaping Use   Vaping Use: Never used  Substance and Sexual Activity   Alcohol use: Yes    Comment: occassional   Drug use: Never   Sexual activity: Not on file  Other Topics Concern   Not on file  Social History Narrative   Not on file   Social Determinants of Health   Financial Resource Strain: Not on file  Food Insecurity: Not on file  Transportation Needs: Not on file  Physical Activity: Not on file  Stress: Not on file  Social Connections: Not on file   Allergies  Allergen Reactions   Other     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   Azithromycin Rash    little spotty rash on back   Erythromycin Rash   Family History  Problem Relation Age of Onset   Heart disease Mother    Diabetes Mother    Kidney failure Mother  Heart failure Father    Hyperlipidemia Sister    Hypertension Sister    Hypertension Brother    Hyperlipidemia Brother    Heart attack Maternal Grandmother    Heart attack Maternal Grandfather    Stroke Maternal Grandfather    Heart failure Paternal Grandmother    Heart attack Paternal Grandfather    Hyperlipidemia Sister    Hypertension Sister    Hyperlipidemia Sister      Current Outpatient Medications (Cardiovascular):    lisinopril (ZESTRIL) 10 MG tablet, Take 1 tablet (10 mg total) by mouth daily.   rosuvastatin  (CRESTOR) 20 MG tablet, Take 1 tablet (20 mg total) by mouth daily.   Current Outpatient Medications (Analgesics):    acetaminophen (TYLENOL) 500 MG tablet, You can take 1000 mg of plain Tylenol/acetaminophen every 8 hours as needed for pain. You can alternate this with ibuprofen. You can buy both of these medications over-the-counter at any drugstore.   aspirin EC 81 MG tablet, Take 1 tablet by mouth daily.   ibuprofen (ADVIL) 200 MG tablet, You can take 2 to 3 tablets every 6 hours as needed for pain. You can alternate this with plain Tylenol/acetaminophen. You can buy both of these medications over-the-counter at any drugstore.   Current Outpatient Medications (Other):    Cholecalciferol (VITAMIN D) 50 MCG (2000 UT) tablet, Take 2,000 Units by mouth daily.    olopatadine (PATANOL) 0.1 % ophthalmic solution, Place 1 drop into both eyes 2 (two) times daily as needed for allergies.   Reviewed prior external information including notes and imaging from  primary care provider As well as notes that were available from care everywhere and other healthcare systems.  Past medical history, social, surgical and family history all reviewed in electronic medical record.  No pertanent information unless stated regarding to the chief complaint.   Review of Systems:  No headache, visual changes, nausea, vomiting, diarrhea, constipation, dizziness, abdominal pain, skin rash, fevers, chills, night sweats, weight loss, swollen lymph nodes, body aches, joint swelling, chest pain, shortness of breath, mood changes. POSITIVE muscle aches  Objective  Blood pressure 118/76, pulse 73, height 5\' 10"  (1.778 m), SpO2 99 %.   General: No apparent distress alert and oriented x3 mood and affect normal, dressed appropriately.  HEENT: Pupils equal, extraocular movements intact  Respiratory: Patient's speak in full sentences and does not appear short of breath  Cardiovascular: No lower extremity edema, non tender, no  erythema  Gait normal with good balance and coordination.  MSK: Left knee exam does show that patient does have improvement in the lateral tracking.  Still some crepitus noted.  No significant worsening in any instability of the knee.  Back exam does have some tightness noted in the parascapular region right greater than left.  Patient also has some tightness noted in the paraspinal musculature of the lumbar spine.  Patient does have tightness with right greater than left.   Osteopathic findings C2 flexed rotated and side bent right C4 flexed rotated and side bent left C7 flexed rotated and side bent left T3 extended rotated and side bent right inhaled third rib T9 extended rotated and side bent left L2 flexed rotated and side bent right Sacrum right on right    Impression and Recommendations:     The above documentation has been reviewed and is accurate and complete Pearlean Brownie, DO

## 2021-05-23 ENCOUNTER — Encounter: Payer: Self-pay | Admitting: Family Medicine

## 2021-05-23 ENCOUNTER — Ambulatory Visit (INDEPENDENT_AMBULATORY_CARE_PROVIDER_SITE_OTHER): Payer: Medicare Other | Admitting: Family Medicine

## 2021-05-23 ENCOUNTER — Other Ambulatory Visit: Payer: Self-pay

## 2021-05-23 VITALS — BP 118/76 | HR 73 | Ht 70.0 in

## 2021-05-23 DIAGNOSIS — M9901 Segmental and somatic dysfunction of cervical region: Secondary | ICD-10-CM

## 2021-05-23 DIAGNOSIS — M9902 Segmental and somatic dysfunction of thoracic region: Secondary | ICD-10-CM | POA: Diagnosis not present

## 2021-05-23 DIAGNOSIS — M9903 Segmental and somatic dysfunction of lumbar region: Secondary | ICD-10-CM | POA: Diagnosis not present

## 2021-05-23 DIAGNOSIS — G2589 Other specified extrapyramidal and movement disorders: Secondary | ICD-10-CM

## 2021-05-23 DIAGNOSIS — M9904 Segmental and somatic dysfunction of sacral region: Secondary | ICD-10-CM

## 2021-05-23 DIAGNOSIS — M9908 Segmental and somatic dysfunction of rib cage: Secondary | ICD-10-CM | POA: Diagnosis not present

## 2021-05-23 NOTE — Assessment & Plan Note (Signed)

## 2021-05-23 NOTE — Assessment & Plan Note (Signed)
Still noted on exam today.  Patient still has some dyskinesis noted on the right side.  Tightness noted there.  Discussed icing regimen and home exercises, discussed which activities to do which wants to avoid.  Increase activity slowly.  Follow-up with me again in 6 to 8 weeks.

## 2021-05-23 NOTE — Patient Instructions (Signed)
Great to see you Enjoy puppies Stay active See me again in 6-8weeks

## 2021-05-25 ENCOUNTER — Ambulatory Visit: Payer: Medicare Other | Admitting: Family Medicine

## 2021-06-08 ENCOUNTER — Other Ambulatory Visit: Payer: Self-pay | Admitting: Internal Medicine

## 2021-07-11 ENCOUNTER — Ambulatory Visit: Payer: Medicare Other | Admitting: Family Medicine

## 2021-07-29 ENCOUNTER — Other Ambulatory Visit: Payer: Self-pay | Admitting: Internal Medicine

## 2021-08-01 ENCOUNTER — Telehealth (INDEPENDENT_AMBULATORY_CARE_PROVIDER_SITE_OTHER): Payer: Medicare Other | Admitting: Family

## 2021-08-01 ENCOUNTER — Encounter: Payer: Self-pay | Admitting: Family

## 2021-08-01 VITALS — Ht 70.0 in | Wt 195.0 lb

## 2021-08-01 DIAGNOSIS — I10 Essential (primary) hypertension: Secondary | ICD-10-CM

## 2021-08-01 DIAGNOSIS — E7801 Familial hypercholesterolemia: Secondary | ICD-10-CM

## 2021-08-01 MED ORDER — LISINOPRIL 10 MG PO TABS: 10.0000 mg | ORAL_TABLET | Freq: Every day | ORAL | 3 refills | Status: DC

## 2021-08-01 MED ORDER — ROSUVASTATIN CALCIUM 20 MG PO TABS: 20.0000 mg | ORAL_TABLET | Freq: Every day | ORAL | 3 refills | Status: DC

## 2021-08-01 NOTE — Progress Notes (Signed)
?Sheila Atkinson is a 66 y.o. female with the following history as recorded in [employer]:  ?Patient Active Problem List  ? Diagnosis Date Noted  ? Scapular dyskinesis 04/13/2021  ? Patellofemoral syndrome of left knee 04/13/2021  ? Somatic dysfunction of spine, cervical 04/13/2021  ? Gallstones 08/09/2019  ? Chest pain 08/09/2019  ? Familial hypercholesterolemia 07/05/2018  ? Essential hypertension 07/05/2018  ?  ?Current Outpatient Medications  ?Medication Sig Dispense Refill  ? aspirin EC 81 MG tablet Take 1 tablet by mouth daily.    ? Cholecalciferol (VITAMIN D) 50 MCG (2000 UT) tablet Take 2,000 Units by mouth daily.     ? ibuprofen (ADVIL) 200 MG tablet You can take 2 to 3 tablets every 6 hours as needed for pain. You can alternate this with plain Tylenol/acetaminophen. You can buy both of these medications over-the-counter at any drugstore.    ? olopatadine (PATANOL) 0.1 % ophthalmic solution Place 1 drop into both eyes 2 (two) times daily as needed for allergies.    ? lisinopril (ZESTRIL) 10 MG tablet Take 1 tablet (10 mg total) by mouth daily. 90 tablet 3  ? rosuvastatin (CRESTOR) 20 MG tablet Take 1 tablet (20 mg total) by mouth daily. 90 tablet 3  ? ?No current facility-administered medications for this visit.  ?  ?Allergies: Other, Azithromycin, and Erythromycin  ?Past Medical History:  ?Diagnosis Date  ? Anomaly   ? heart structure - ischemia  ? Hyperlipidemia   ? Hypertension   ?  ?Past Surgical History:  ?Procedure Laterality Date  ? [place]  ? CHOLECYSTECTOMY N/A 08/10/2019  ? Procedure: LAPAROSCOPIC CHOLECYSTECTOMY WITH  INTRAOPERATIVE CHOLANGIOGRAM;  Surgeon: Stark Klein, MD;  Location: [facility];  Service: General;  Laterality: N/A;  ? TONSILLECTOMY    ?  ?Family History  ?Problem Relation Age of Onset  ? Heart disease Mother   ? Diabetes Mother   ? Kidney failure Mother   ? Heart failure Father   ? Hyperlipidemia Sister   ? Hypertension Sister   ? Hypertension Brother   ?  Hyperlipidemia Brother   ? Heart attack Maternal Grandmother   ? Heart attack Maternal Grandfather   ? Stroke Maternal Grandfather   ? Heart failure Paternal Grandmother   ? Heart attack Paternal Grandfather   ? Hyperlipidemia Sister   ? Hypertension Sister   ? Hyperlipidemia Sister   ?  ?Social History  ? ?Tobacco Use  ? Smoking status: Never  ? Smokeless tobacco: Never  ?Substance Use Topics  ? Alcohol use: Yes  ?  Comment: occassional  ?  ?Subjective:  ? ? ?I connected with Gerrit Halls on 08/01/21 at  1:20 PM EDT by a video enabled telemedicine application and verified that I am speaking with the correct person using two identifiers. ?  ?I discussed the limitations of evaluation and management by telemedicine and the availability of in person appointments. The patient expressed understanding and agreed to proceed. ?Provider in office/ patient is at home; provider and patient are only 2 people on video call.  ? ?Requesting refill on Lisinopril and Crestor; has historically been managed by cardiology; Denies any chest pain, shortness of breath, blurred vision or headache. Does check blood pressure at home- averaging 117-118/ 70-75;  ? ?Questioning need for DEXA as well;  ? ? ? ? ? ?  ?Objective:  ?Vitals:  ? 08/01/21 1317  ?Weight: 195 lb (88.5 kg)  ?Height: 5\' 10"  (1.778 m)  ?  ?General: Well  developed, well nourished, in no acute distress  ?Skin : Warm and dry.  ?Head: Normocephalic and atraumatic  ?Lungs: Respirations unlabored;  ?Neurologic: Alert and oriented; speech intact; face symmetrical;  ? ?Assessment:  ?1. Essential hypertension   ?2. Familial hypercholesterolemia   ?  ?Plan:  ?Stable; refill updated; ?Stable; refill updated;  ? ?Plan for yearly CPE in October 2023; will plan for DEXA and mammogram together in October 2023;  ? ?This visit occurred during the SARS-CoV-2 public health emergency.  Safety protocols were in place, including screening questions prior to the visit, additional usage of  staff PPE, and extensive cleaning of exam room while observing appropriate contact time as indicated for disinfecting solutions.  ? ? ?No follow-ups on file.  ?No orders of the defined types were placed in this encounter. ?  ?Requested Prescriptions  ? ?Signed Prescriptions Disp Refills  ? lisinopril (ZESTRIL) 10 MG tablet 90 tablet 3  ?  Sig: Take 1 tablet (10 mg total) by mouth daily.  ? rosuvastatin (CRESTOR) 20 MG tablet 90 tablet 3  ?  Sig: Take 1 tablet (20 mg total) by mouth daily.  ?  ? ?

## 2021-08-08 ENCOUNTER — Ambulatory Visit: Payer: Medicare Other | Admitting: Family

## 2021-09-29 ENCOUNTER — Encounter: Payer: Medicare Other | Admitting: Family

## 2021-12-25 ENCOUNTER — Encounter: Admit: 2021-12-25 | Discharge: 2021-12-25 | Payer: MEDICARE
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
## 2022-02-09 IMAGING — MG MM DIGITAL SCREENING BILAT W/ TOMO AND CAD
8 series · 8 of 24 positions shown · non-contrast
Comparison: None.
COMPARISON: None.

Addendum:
CLINICAL DATA: Screening.

EXAM:
DIGITAL SCREENING BILATERAL MAMMOGRAM WITH TOMOSYNTHESIS AND CAD
TECHNIQUE: Bilateral screening digital craniocaudal and mediolateral oblique
mammograms were obtained. Bilateral screening digital breast
tomosynthesis was performed. The images were evaluated with
computer-aided detection.

[R CC synth-2D]
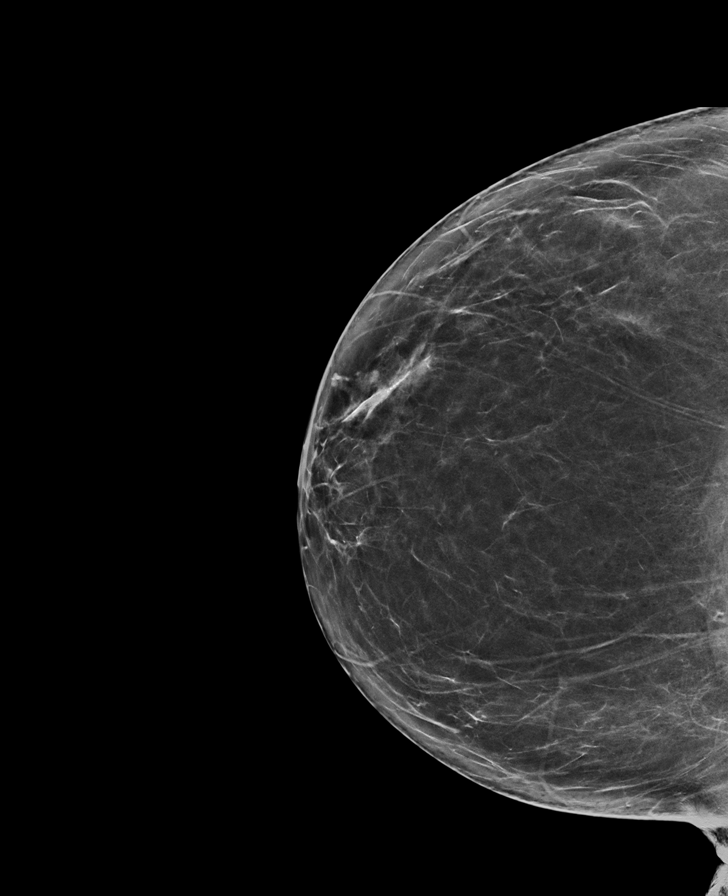

[L MLO synth-2D]
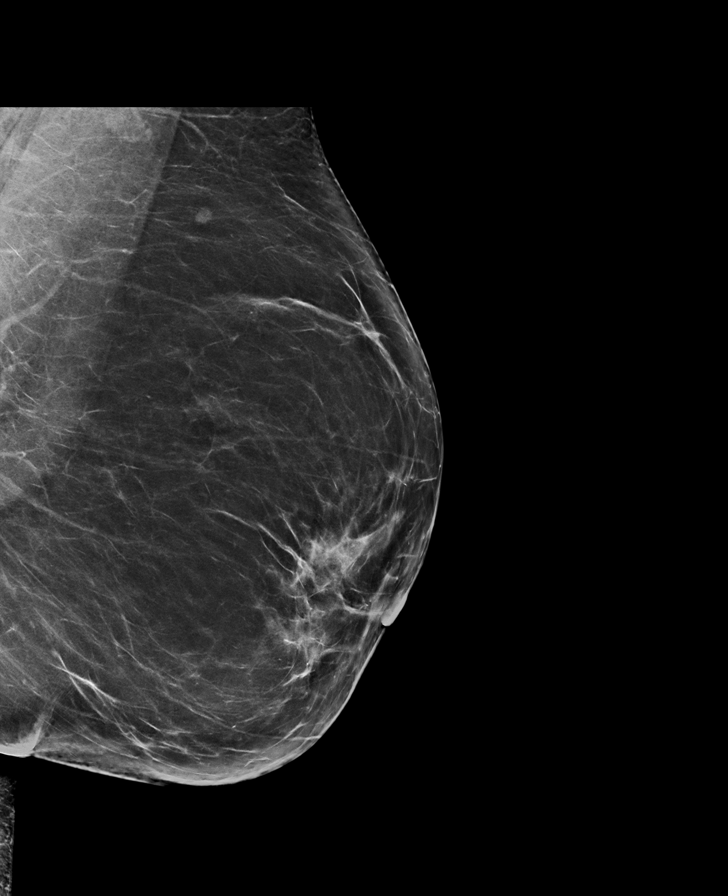

[L CC synth-2D]
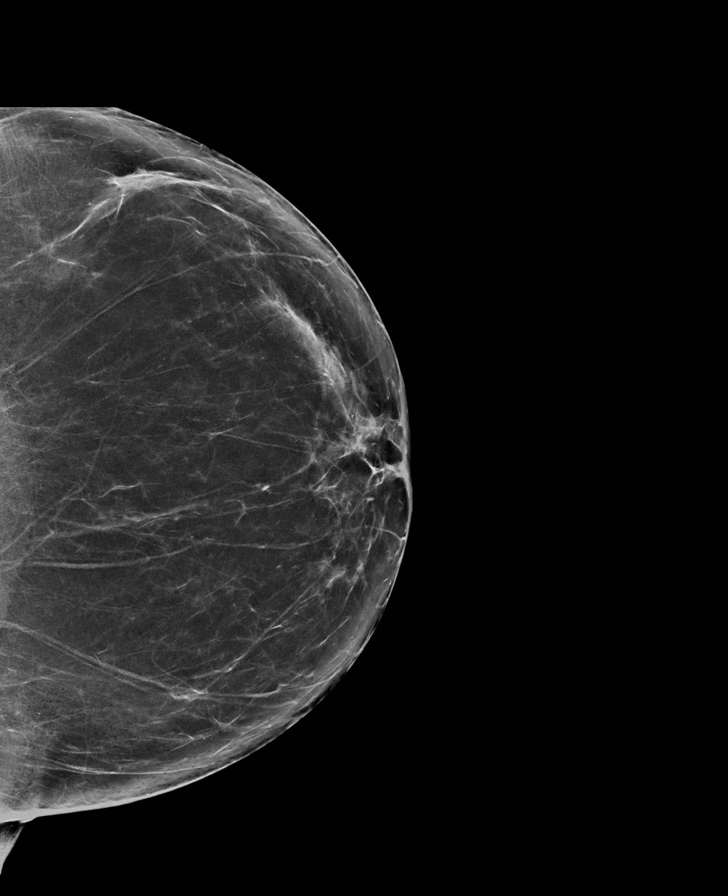

[R MLO synth-2D]
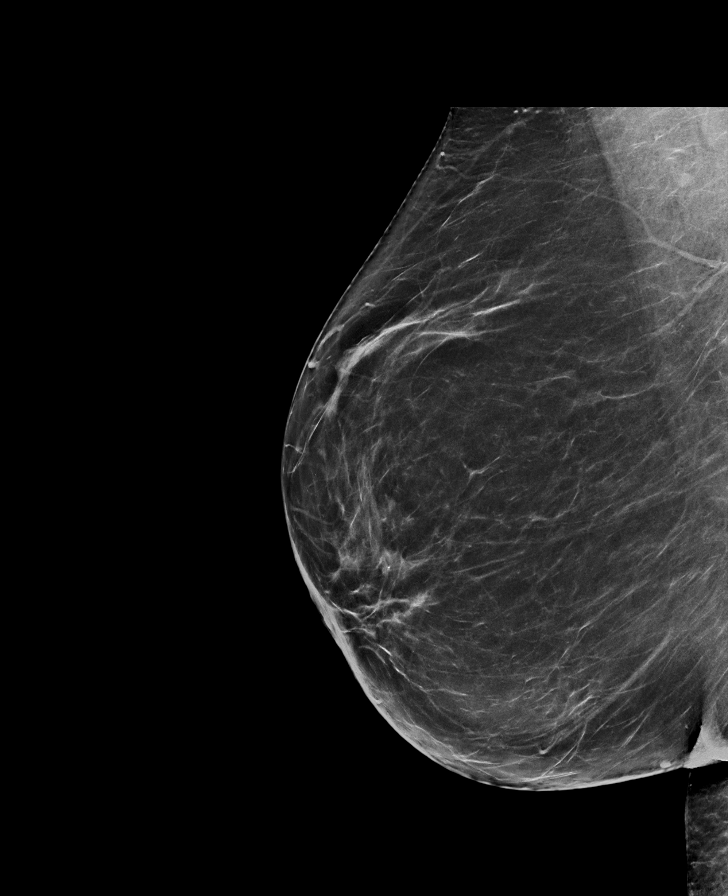

[L CC tomo · tomo slice 37/74.0]
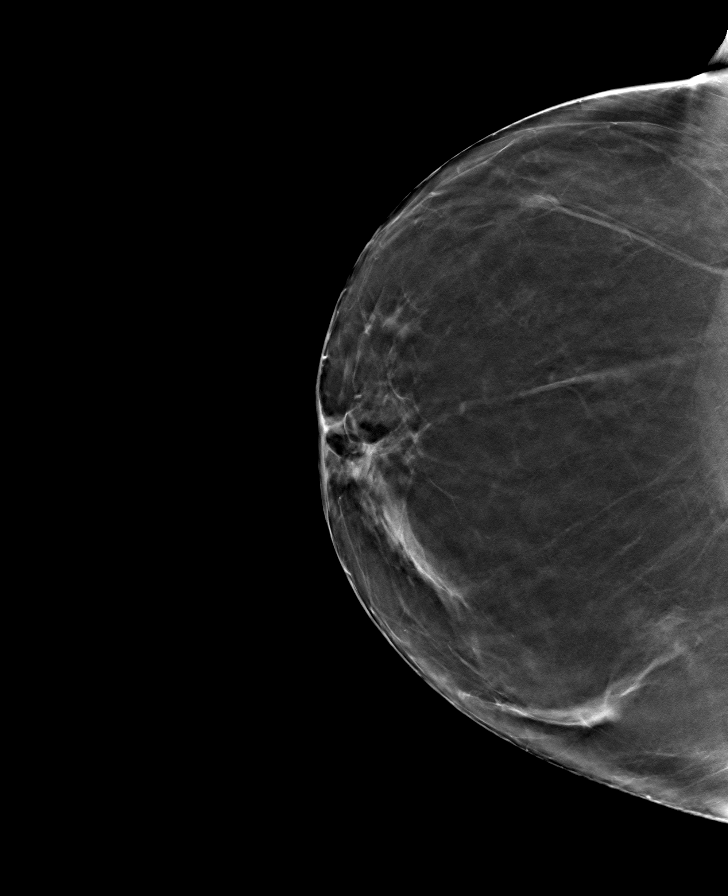

[L MLO tomo · tomo slice 41/82.0]
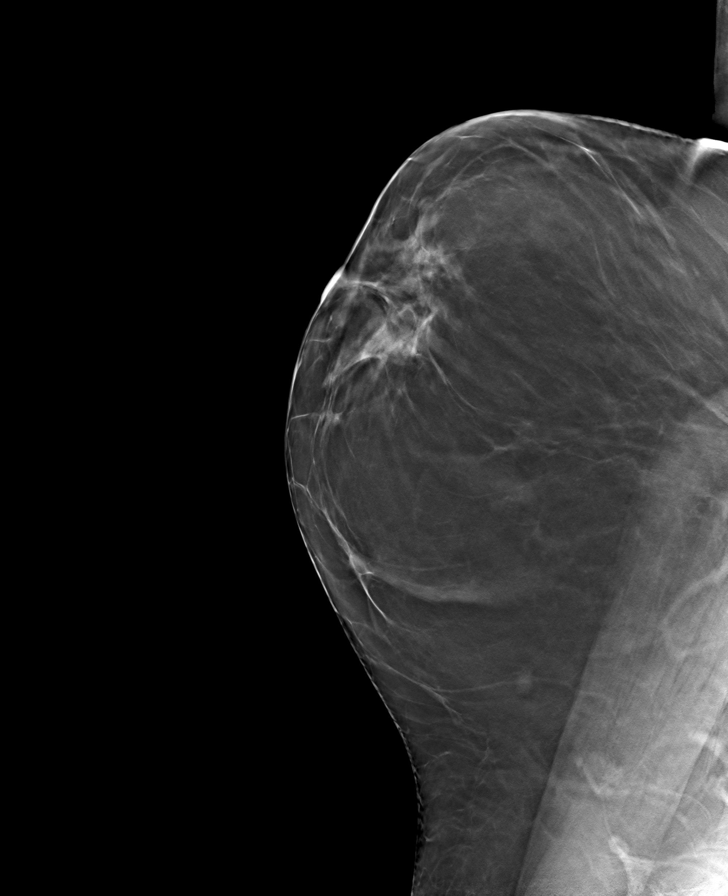

[R CC tomo · tomo slice 36/71.0]
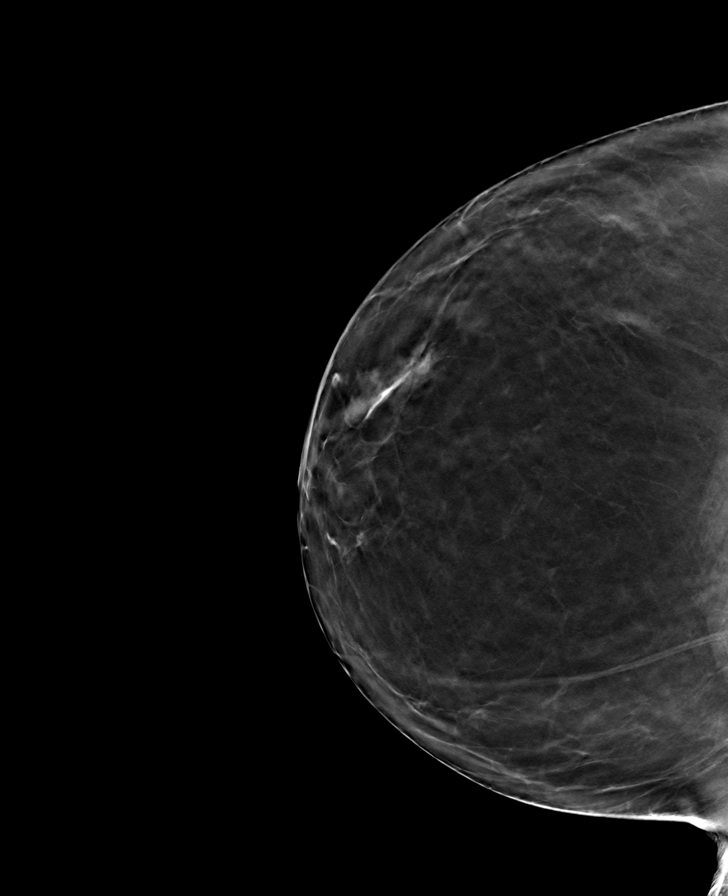

[R MLO tomo · tomo slice 40/79.0]
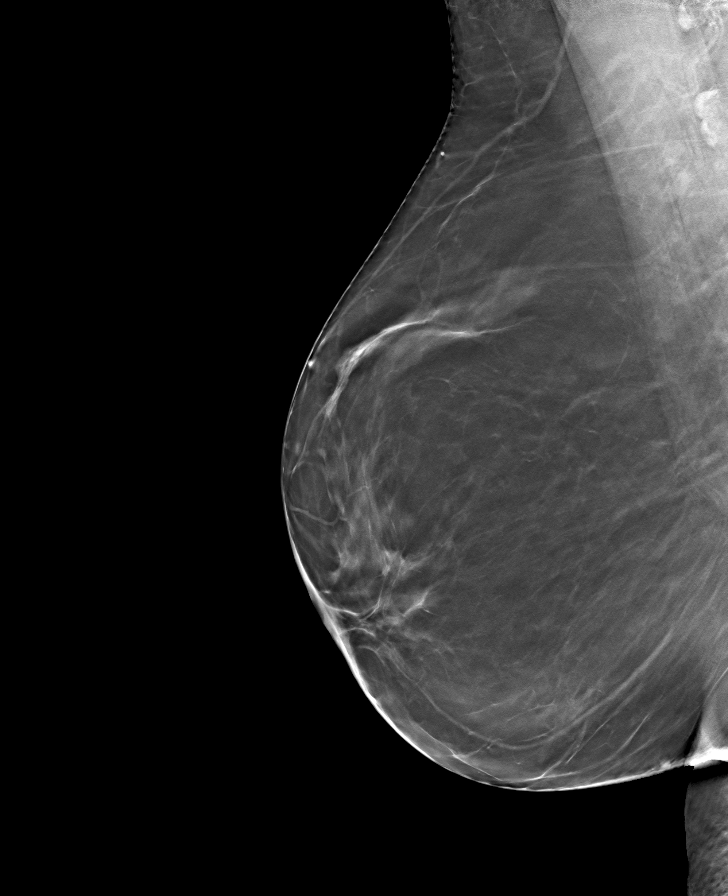

[8 of 24 positions shown; findings below may reference images not displayed]

ACR Breast Density Category b: There are scattered areas of
fibroglandular density.
FINDINGS: There are no findings suspicious for malignancy.
IMPRESSION: No mammographic evidence of malignancy. A result letter of this
screening mammogram will be mailed directly to the patient.

RECOMMENDATION:
Screening mammogram in one year. (Code:5A-X-1MI)

BI-RADS CATEGORY  1: Negative.

ADDENDUM:
Patient's prior exams have become available for comparison. There
are no significant changes compared to the prior exams.

*** End of Addendum ***
ACR Breast Density Category b: There are scattered areas of
fibroglandular density.
FINDINGS: There are no findings suspicious for malignancy.
IMPRESSION: No mammographic evidence of malignancy. A result letter of this
screening mammogram will be mailed directly to the patient.

RECOMMENDATION:
Screening mammogram in one year. (Code:5A-X-1MI)

BI-RADS CATEGORY  1: Negative.

## 2022-02-15 ENCOUNTER — Encounter: Payer: Self-pay | Admitting: Family

## 2022-02-15 ENCOUNTER — Ambulatory Visit (INDEPENDENT_AMBULATORY_CARE_PROVIDER_SITE_OTHER): Payer: Medicare Other | Admitting: Family

## 2022-02-15 VITALS — BP 116/78 | HR 91 | Temp 97.8°F | Resp 18 | Ht 70.0 in | Wt 190.0 lb

## 2022-02-15 DIAGNOSIS — I10 Essential (primary) hypertension: Secondary | ICD-10-CM

## 2022-02-15 DIAGNOSIS — E785 Hyperlipidemia, unspecified: Secondary | ICD-10-CM

## 2022-02-15 DIAGNOSIS — H01009 Unspecified blepharitis unspecified eye, unspecified eyelid: Secondary | ICD-10-CM | POA: Diagnosis not present

## 2022-02-15 LAB — CBC WITH DIFFERENTIAL/PLATELET
Basophils Absolute: 0 10*3/uL (ref 0.0–0.1)
Basophils Relative: 0.6 % (ref 0.0–3.0)
Eosinophils Absolute: 0.2 10*3/uL (ref 0.0–0.7)
Eosinophils Relative: 2.6 % (ref 0.0–5.0)
HCT: 42.4 % (ref 36.0–46.0)
Hemoglobin: 14.1 g/dL (ref 12.0–15.0)
Lymphocytes Relative: 33.9 % (ref 12.0–46.0)
Lymphs Abs: 2.1 10*3/uL (ref 0.7–4.0)
MCHC: 33.2 g/dL (ref 30.0–36.0)
MCV: 87.2 fl (ref 78.0–100.0)
Monocytes Absolute: 0.4 10*3/uL (ref 0.1–1.0)
Monocytes Relative: 7.1 % (ref 3.0–12.0)
Neutro Abs: 3.5 10*3/uL (ref 1.4–7.7)
Neutrophils Relative %: 55.8 % (ref 43.0–77.0)
Platelets: 246 10*3/uL (ref 150.0–400.0)
RBC: 4.87 Mil/uL (ref 3.87–5.11)
RDW: 13.7 % (ref 11.5–15.5)
WBC: 6.2 10*3/uL (ref 4.0–10.5)

## 2022-02-15 LAB — COMPREHENSIVE METABOLIC PANEL
ALT: 20 U/L (ref 0–35)
AST: 19 U/L (ref 0–37)
Albumin: 4.5 g/dL (ref 3.5–5.2)
Alkaline Phosphatase: 58 U/L (ref 39–117)
BUN: 13 mg/dL (ref 6–23)
CO2: 28 mEq/L (ref 19–32)
Calcium: 9.7 mg/dL (ref 8.4–10.5)
Chloride: 102 mEq/L (ref 96–112)
Creatinine, Ser: 0.89 mg/dL (ref 0.40–1.20)
GFR: 67.78 mL/min (ref >60.00)
Glucose, Bld: 84 mg/dL (ref 70–99)
Potassium: 5 mEq/L (ref 3.5–5.1)
Sodium: 138 mEq/L (ref 135–145)
Total Bilirubin: 0.4 mg/dL (ref 0.2–1.2)
Total Protein: 7 g/dL (ref 6.0–8.3)

## 2022-02-15 LAB — LIPID PANEL
Cholesterol: 194 mg/dL (ref 0–200)
HDL: 57.6 mg/dL (ref >39.00)
LDL Cholesterol: 99 mg/dL (ref 0–99)
NonHDL: 136.12
Total CHOL/HDL Ratio: 3
Triglycerides: 187 mg/dL — ABNORMAL HIGH (ref 0.0–149.0)
VLDL: 37.4 mg/dL (ref 0.0–40.0)

## 2022-02-15 MED ORDER — LISINOPRIL 10 MG PO TABS: 10.0000 mg | ORAL_TABLET | Freq: Every day | ORAL | 3 refills | Status: DC

## 2022-02-15 MED ORDER — ROSUVASTATIN CALCIUM 20 MG PO TABS
20.0000 mg | ORAL_TABLET | Freq: Every day | ORAL | 3 refills | Status: DC
Start: 2022-02-15 — End: 2023-03-25

## 2022-02-15 NOTE — Progress Notes (Signed)
Sheila Atkinson is a 66 y.o. female with the following history as recorded in EpicCare:  Patient Active Problem List   Diagnosis Date Noted   Scapular dyskinesis 04/13/2021   Patellofemoral syndrome of left knee 04/13/2021   Somatic dysfunction of spine, cervical 04/13/2021   Gallstones 08/09/2019   Chest pain 08/09/2019   Familial hypercholesterolemia 07/05/2018   Essential hypertension 07/05/2018    Current Outpatient Medications  Medication Sig Dispense Refill   aspirin EC 81 MG tablet Take 1 tablet by mouth daily.     Cholecalciferol (VITAMIN D) 50 MCG (2000 UT) tablet Take 2,000 Units by mouth daily.      ibuprofen (ADVIL) 200 MG tablet You can take 2 to 3 tablets every 6 hours as needed for pain. You can alternate this with plain Tylenol/acetaminophen. You can buy both of these medications over-the-counter at any drugstore.     olopatadine (PATANOL) 0.1 % ophthalmic solution Place 1 drop into both eyes 2 (two) times daily as needed for allergies.     lisinopril (ZESTRIL) 10 MG tablet Take 1 tablet (10 mg total) by mouth daily. 90 tablet 3   rosuvastatin (CRESTOR) 20 MG tablet Take 1 tablet (20 mg total) by mouth daily. 90 tablet 3   No current facility-administered medications for this visit.    Allergies: Other, Azithromycin, and Erythromycin  Past Medical History:  Diagnosis Date   Anomaly    heart structure - ischemia   Hyperlipidemia    Hypertension     Past Surgical History:  Procedure Laterality Date   CESAREAN SECTION  1995   CHOLECYSTECTOMY N/A 08/10/2019   Procedure: LAPAROSCOPIC CHOLECYSTECTOMY WITH  INTRAOPERATIVE CHOLANGIOGRAM;  Surgeon: Stark Klein, MD;  Location: [facility];  Service: General;  Laterality: N/A;   TONSILLECTOMY      Family History  Problem Relation Age of Onset   Heart disease Mother    Diabetes Mother    Kidney failure Mother    Heart failure Father    Hyperlipidemia Sister    Hypertension Sister    Hypertension Brother     Hyperlipidemia Brother    Heart attack Maternal Grandmother    Heart attack Maternal Grandfather    Stroke Maternal Grandfather    Heart failure Paternal Grandmother    Heart attack Paternal Grandfather    Hyperlipidemia Sister    Hypertension Sister    Hyperlipidemia Sister     Social History   Tobacco Use   Smoking status: Never   Smokeless tobacco: Never  Substance Use Topics   Alcohol use: Yes    Comment: occassional    Subjective:   Presents for yearly follow up; defers vaccines; needs refills updated;   Health Maintenance  Topic Date Due   DEXA SCAN  02/16/2023 (Originally 03/06/2021)   MAMMOGRAM  02/08/2023   Fecal DNA (Cologuard)  04/13/2024   INFLUENZA VACCINE  Completed   Hepatitis C Screening  Completed   HIV Screening  Completed   HPV VACCINES  Aged Out   Pneumonia Vaccine 6+ Years old  Discontinued   PAP SMEAR-Modifier  Discontinued   TETANUS/TDAP  Discontinued   COVID-19 Vaccine  Discontinued   Zoster Vaccines- Shingrix  Discontinued       Objective:  Vitals:   02/15/22 0939  BP: 116/78  Pulse: 91  Resp: 18  Temp: 97.8 F (36.6 C)  TempSrc: Oral  SpO2: 99%  Weight: 190 lb (86.2 kg)  Height: 5' 10"  (1.778 m)    General: Well developed, well nourished,  in no acute distress  Skin : Warm and dry.  Head: Normocephalic and atraumatic  Eyes: Sclera and conjunctiva clear; pupils round and reactive to light; extraocular movements intact  Ears: External normal; canals clear; tympanic membranes normal  Oropharynx: Pink, supple. No suspicious lesions  Neck: Supple without thyromegaly, adenopathy  Lungs: Respirations unlabored; clear to auscultation bilaterally without wheeze, rales, rhonchi  CVS exam: normal rate and regular rhythm.  Abdomen: Soft; nontender; nondistended; normoactive bowel sounds; no masses or hepatosplenomegaly  Musculoskeletal: No deformities; no active joint inflammation  Extremities: No edema, cyanosis, clubbing  Vessels:  Symmetric bilaterally  Neurologic: Alert and oriented; speech intact; face symmetrical; moves all extremities well; CNII-XII intact without focal deficit   Assessment:  1. Essential hypertension   2. Hyperlipidemia, unspecified hyperlipidemia type   3. Blepharitis, unspecified laterality, unspecified type     Plan:  Age appropriate preventive healthcare needs addressed; encouraged regular eye doctor and dental exams; encouraged regular exercise; will update labs and refills as needed today; follow-up to be determined; Will refer to establish care with ophthalmology for yearly eye exam and follow up on blepharitis;  Patient defers vaccines today and will plan for mammogram in October 2024;      No follow-ups on file.  Orders Placed This Encounter  Procedures   CBC with Differential/Platelet   Comp Met (CMET)   Lipid panel   Ambulatory referral to Ophthalmology    Referral Priority:   Routine    Referral Type:   Consultation    Referral Reason:   Specialty Services Required    Requested Specialty:   Ophthalmology    Number of Visits Requested:   1    Requested Prescriptions   Signed Prescriptions Disp Refills   lisinopril (ZESTRIL) 10 MG tablet 90 tablet 3    Sig: Take 1 tablet (10 mg total) by mouth daily.   rosuvastatin (CRESTOR) 20 MG tablet 90 tablet 3    Sig: Take 1 tablet (20 mg total) by mouth daily.

## 2022-02-16 ENCOUNTER — Telehealth: Payer: Self-pay | Admitting: Family

## 2022-02-16 NOTE — Telephone Encounter (Signed)
FYILovena Le with [facility] called to advise they do not have any new patient appts until the first of December and patient may need to be referred to another provider if she needs to be seen before then.

## 2022-02-19 NOTE — Telephone Encounter (Signed)
I have called pt and left a message to call back.  

## 2022-06-04 ENCOUNTER — Encounter: Payer: Self-pay | Admitting: Family

## 2022-06-04 ENCOUNTER — Other Ambulatory Visit: Payer: Self-pay | Admitting: Family

## 2022-06-04 DIAGNOSIS — R197 Diarrhea, unspecified: Secondary | ICD-10-CM

## 2023-03-25 ENCOUNTER — Other Ambulatory Visit: Payer: Self-pay | Admitting: Family

## 2023-04-09 ENCOUNTER — Encounter: Payer: Self-pay | Admitting: Family

## 2023-04-09 ENCOUNTER — Ambulatory Visit (INDEPENDENT_AMBULATORY_CARE_PROVIDER_SITE_OTHER): Payer: Medicare Other | Admitting: Family

## 2023-04-09 VITALS — BP 120/64 | HR 84 | Temp 98.3°F | Resp 18 | Ht 70.0 in | Wt 190.0 lb

## 2023-04-09 DIAGNOSIS — Z13220 Encounter for screening for lipoid disorders: Secondary | ICD-10-CM

## 2023-04-09 DIAGNOSIS — Z13820 Encounter for screening for osteoporosis: Secondary | ICD-10-CM | POA: Diagnosis not present

## 2023-04-09 DIAGNOSIS — I10 Essential (primary) hypertension: Secondary | ICD-10-CM | POA: Diagnosis not present

## 2023-04-09 DIAGNOSIS — E2839 Other primary ovarian failure: Secondary | ICD-10-CM | POA: Diagnosis not present

## 2023-04-09 DIAGNOSIS — Z1231 Encounter for screening mammogram for malignant neoplasm of breast: Secondary | ICD-10-CM

## 2023-04-09 LAB — LIPID PANEL
Cholesterol: 198 mg/dL (ref 0–200)
HDL: 55.5 mg/dL (ref >39.00)
LDL Cholesterol: 98 mg/dL (ref 0–99)
NonHDL: 142.54
Total CHOL/HDL Ratio: 4
Triglycerides: 225 mg/dL — ABNORMAL HIGH (ref 0.0–149.0)
VLDL: 45 mg/dL — ABNORMAL HIGH (ref 0.0–40.0)

## 2023-04-09 LAB — COMPREHENSIVE METABOLIC PANEL
ALT: 25 U/L (ref 0–35)
AST: 20 U/L (ref 0–37)
Albumin: 4.6 g/dL (ref 3.5–5.2)
Alkaline Phosphatase: 58 U/L (ref 39–117)
BUN: 11 mg/dL (ref 6–23)
CO2: 30 meq/L (ref 19–32)
Calcium: 9.5 mg/dL (ref 8.4–10.5)
Chloride: 102 meq/L (ref 96–112)
Creatinine, Ser: 0.82 mg/dL (ref 0.40–1.20)
GFR: 74.19 mL/min (ref >60.00)
Glucose, Bld: 93 mg/dL (ref 70–99)
Potassium: 4.6 meq/L (ref 3.5–5.1)
Sodium: 137 meq/L (ref 135–145)
Total Bilirubin: 0.5 mg/dL (ref 0.2–1.2)
Total Protein: 7.3 g/dL (ref 6.0–8.3)

## 2023-04-09 LAB — CBC WITH DIFFERENTIAL/PLATELET
Basophils Absolute: 0 10*3/uL (ref 0.0–0.1)
Basophils Relative: 0.7 % (ref 0.0–3.0)
Eosinophils Absolute: 0.1 10*3/uL (ref 0.0–0.7)
Eosinophils Relative: 2.2 % (ref 0.0–5.0)
HCT: 44.7 % (ref 36.0–46.0)
Hemoglobin: 14.7 g/dL (ref 12.0–15.0)
Lymphocytes Relative: 33.7 % (ref 12.0–46.0)
Lymphs Abs: 1.9 10*3/uL (ref 0.7–4.0)
MCHC: 33 g/dL (ref 30.0–36.0)
MCV: 88.5 fL (ref 78.0–100.0)
Monocytes Absolute: 0.4 10*3/uL (ref 0.1–1.0)
Monocytes Relative: 7.2 % (ref 3.0–12.0)
Neutro Abs: 3.2 10*3/uL (ref 1.4–7.7)
Neutrophils Relative %: 56.2 % (ref 43.0–77.0)
Platelets: 246 10*3/uL (ref 150.0–400.0)
RBC: 5.05 Mil/uL (ref 3.87–5.11)
RDW: 13.8 % (ref 11.5–15.5)
WBC: 5.6 10*3/uL (ref 4.0–10.5)

## 2023-04-09 MED ORDER — LISINOPRIL 10 MG PO TABS: 10.0000 mg | ORAL_TABLET | Freq: Every day | ORAL | 3 refills | Status: DC

## 2023-04-09 MED ORDER — ROSUVASTATIN CALCIUM 20 MG PO TABS: 20.0000 mg | ORAL_TABLET | Freq: Every day | ORAL | 3 refills | Status: DC

## 2023-04-09 NOTE — Progress Notes (Signed)
Sheila Atkinson is a 67 y.o. female with the following history as recorded in EpicCare:  Patient Active Problem List   Diagnosis Date Noted   Scapular dyskinesis 04/13/2021   Patellofemoral syndrome of left knee 04/13/2021   Somatic dysfunction of spine, cervical 04/13/2021   Gallstones 08/09/2019   Chest pain 08/09/2019   Familial hypercholesterolemia 07/05/2018   Essential hypertension 07/05/2018    Current Outpatient Medications  Medication Sig Dispense Refill   aspirin EC 81 MG tablet Take 1 tablet by mouth daily.     Cholecalciferol (VITAMIN D) 50 MCG (2000 UT) tablet Take 2,000 Units by mouth daily.      ibuprofen (ADVIL) 200 MG tablet You can take 2 to 3 tablets every 6 hours as needed for pain. You can alternate this with plain Tylenol/acetaminophen. You can buy both of these medications over-the-counter at any drugstore.     olopatadine (PATANOL) 0.1 % ophthalmic solution Place 1 drop into both eyes 2 (two) times daily as needed for allergies.     lisinopril (ZESTRIL) 10 MG tablet Take 1 tablet (10 mg total) by mouth daily. 90 tablet 3   rosuvastatin (CRESTOR) 20 MG tablet Take 1 tablet (20 mg total) by mouth daily. 90 tablet 3   No current facility-administered medications for this visit.    Allergies: Other, Azithromycin, and Erythromycin  Past Medical History:  Diagnosis Date   Anomaly    heart structure - ischemia   Hyperlipidemia    Hypertension     Past Surgical History:  Procedure Laterality Date   CESAREAN SECTION  1995   CHOLECYSTECTOMY N/A 08/10/2019   Procedure: LAPAROSCOPIC CHOLECYSTECTOMY WITH  INTRAOPERATIVE CHOLANGIOGRAM;  Surgeon: Almond Lint, MD;  Location: [facility];  Service: General;  Laterality: N/A;   TONSILLECTOMY      Family History  Problem Relation Age of Onset   Heart disease Mother    Diabetes Mother    Kidney failure Mother    Heart failure Father    Hyperlipidemia Sister    Hypertension Sister    Hypertension Brother     Hyperlipidemia Brother    Heart attack Maternal Grandmother    Heart attack Maternal Grandfather    Stroke Maternal Grandfather    Heart failure Paternal Grandmother    Heart attack Paternal Grandfather    Hyperlipidemia Sister    Hypertension Sister    Hyperlipidemia Sister     Social History   Tobacco Use   Smoking status: Never   Smokeless tobacco: Never  Substance Use Topics   Alcohol use: Yes    Comment: occassional    Subjective:   Follow up on chronic care needs; no acute concerns today; does need refills on medications; Denies any chest pain, shortness of breath, blurred vision or headache Will be having 1st grandchild born next summer- very excited about this life change;   Objective:  Vitals:   04/09/23 1021  BP: 120/64  Pulse: 84  Resp: 18  Temp: 98.3 F (36.8 C)  TempSrc: Oral  SpO2: 95%  Weight: 190 lb (86.2 kg)  Height: 5\' 10"  (1.778 m)    General: Well developed, well nourished, in no acute distress  Skin : Warm and dry.  Head: Normocephalic and atraumatic  Eyes: Sclera and conjunctiva clear; pupils round and reactive to light; extraocular movements intact  Ears: External normal; canals clear; tympanic membranes normal  Oropharynx: Pink, supple. No suspicious lesions  Neck: Supple without thyromegaly, adenopathy  Lungs: Respirations unlabored; clear to auscultation bilaterally without  wheeze, rales, rhonchi  CVS exam: normal rate and regular rhythm.  Abdomen: Soft; nontender; nondistended; normoactive bowel sounds; no masses or hepatosplenomegaly  Musculoskeletal: No deformities; no active joint inflammation  Extremities: No edema, cyanosis, clubbing  Vessels: Symmetric bilaterally  Neurologic: Alert and oriented; speech intact; face symmetrical; moves all extremities well; CNII-XII intact without focal deficit   Assessment:  1. Primary hypertension   2. Lipid screening   3. Visit for screening mammogram   4. Ovarian failure   5. Osteoporosis  screening     Plan:  Age appropriate preventive healthcare needs addressed; encouraged regular eye doctor and dental exams; encouraged regular exercise; will update labs and refills as needed today; follow-up to be determined; Order for mammogram and DEXA updated; patient prefers to do mammograms every 2 years; defers vaccines as well;   No follow-ups on file.  Orders Placed This Encounter  Procedures   MM Digital Screening    Standing Status:   Future    Standing Expiration Date:   04/08/2024    Scheduling Instructions:     Please schedule with bone density    Order Specific Question:   Reason for Exam (SYMPTOM  OR DIAGNOSIS REQUIRED)    Answer:   screening mammogram    Order Specific Question:   Preferred imaging location?    Answer:   [facility]   DG Bone Density    Standing Status:   Future    Standing Expiration Date:   04/08/2024    Scheduling Instructions:     Please schedule with mammogram    Order Specific Question:   Reason for Exam (SYMPTOM  OR DIAGNOSIS REQUIRED)    Answer:   ovarian failure/ osteoporos screening    Order Specific Question:   Preferred imaging location?    Answer:   [facility]   CBC with Differential/Platelet   Comp Met (CMET)   Lipid panel    Requested Prescriptions   Signed Prescriptions Disp Refills   lisinopril (ZESTRIL) 10 MG tablet 90 tablet 3    Sig: Take 1 tablet (10 mg total) by mouth daily.   rosuvastatin (CRESTOR) 20 MG tablet 90 tablet 3    Sig: Take 1 tablet (20 mg total) by mouth daily.    n

## 2023-04-17 ENCOUNTER — Ambulatory Visit (HOSPITAL_BASED_OUTPATIENT_CLINIC_OR_DEPARTMENT_OTHER)
Admission: RE | Admit: 2023-04-17 | Discharge: 2023-04-17 | Disposition: A | Discharge status: Home / Self Care | Payer: Medicare Other | Source: Ambulatory Visit | Attending: Family | Admitting: Family

## 2023-04-17 ENCOUNTER — Encounter (HOSPITAL_BASED_OUTPATIENT_CLINIC_OR_DEPARTMENT_OTHER): Payer: Self-pay

## 2023-04-17 ENCOUNTER — Encounter: Payer: Self-pay | Admitting: Family

## 2023-04-17 DIAGNOSIS — Z13820 Encounter for screening for osteoporosis: Secondary | ICD-10-CM | POA: Insufficient documentation

## 2023-04-17 DIAGNOSIS — Z1231 Encounter for screening mammogram for malignant neoplasm of breast: Secondary | ICD-10-CM | POA: Diagnosis present

## 2023-04-17 DIAGNOSIS — E2839 Other primary ovarian failure: Secondary | ICD-10-CM | POA: Insufficient documentation

## 2023-07-16 ENCOUNTER — Ambulatory Visit (INDEPENDENT_AMBULATORY_CARE_PROVIDER_SITE_OTHER): Payer: Medicare Other

## 2023-07-16 VITALS — Ht 70.0 in | Wt 190.0 lb

## 2023-07-16 DIAGNOSIS — Z0000 Encounter for general adult medical examination without abnormal findings: Secondary | ICD-10-CM | POA: Diagnosis not present

## 2023-07-16 NOTE — Patient Instructions (Addendum)
 Ms. Sampath , Thank you for taking time to come for your Medicare Wellness Visit. I appreciate your ongoing commitment to your health goals. Please review the following plan we discussed and let me know if I can assist you in the future.   Referrals/Orders/Follow-Ups/Clinician Recommendations:   This is a list of the screening recommended for you and due dates:  Health Maintenance  Topic Date Due   Flu Shot  08/05/2023*   DTaP/Tdap/Td vaccine (2 - Tdap) 04/08/2024*   Cologuard (Stool DNA test)  04/13/2024   Medicare Annual Wellness Visit  07/15/2024   Mammogram  04/16/2025   DEXA scan (bone density measurement)  Completed   Hepatitis C Screening  Completed   HPV Vaccine  Aged Out   Pneumonia Vaccine  Discontinued   COVID-19 Vaccine  Discontinued   Zoster (Shingles) Vaccine  Discontinued  *Topic was postponed. The date shown is not the original due date.    Advanced directives: (Copy Requested) Please bring a copy of your health care power of attorney and living will to the office to be added to your chart at your convenience. You can mail to [facility] [address] [place], [place] [address] or email to [email or web address]  Next Medicare Annual Wellness Visit scheduled for next year: Yes

## 2023-07-16 NOTE — Progress Notes (Signed)
 Subjective:   Sheila Atkinson is a 68 y.o. female who presents for Medicare Annual (Subsequent) preventive examination.  Visit Complete: Virtual I connected with  Shanon Payor on 07/16/23 by a audio enabled telemedicine application and verified that I am speaking with the correct person using two identifiers.  Patient Location: Home  Provider Location: Home Office  I discussed the limitations of evaluation and management by telemedicine. The patient expressed understanding and agreed to proceed.  Vital Signs: Because this visit was a virtual/telehealth visit, some criteria may be missing or patient reported. Any vitals not documented were not able to be obtained and vitals that have been documented are patient reported.  Patient Medicare AWV questionnaire was completed by the patient on 07/09/23; I have confirmed that all information answered by patient is correct and no changes since this date.  Cardiac Risk Factors include: advanced age (>74men, >76 women);hypertension     Objective:    Today's Vitals   07/16/23 1010  Weight: 190 lb (86.2 kg)  Height: 5\' 10"  (1.778 m)   Body mass index is 27.26 kg/m.     07/16/2023   10:15 AM 04/09/2020    4:00 AM 08/10/2019    2:15 PM 08/09/2019    8:10 PM 08/09/2019    1:41 AM  Advanced Directives  Does Patient Have a Medical Advance Directive? Yes No Yes Yes Yes  Type of Estate agent of [place];Living will  Healthcare Power of [place];Living will Healthcare Power of [place];Living will Living will;Healthcare Power of Attorney  Does patient want to make changes to medical advance directive?   No - Patient declined No - Patient declined   Copy of Healthcare Power of Attorney in Chart? No - copy requested  No - copy requested No - copy requested No - copy requested  Would patient like information on creating a medical advance directive?  No - Patient declined       Current Medications (verified) Outpatient Encounter  Medications as of 07/16/2023  Medication Sig   aspirin EC 81 MG tablet Take 1 tablet by mouth daily.   Cholecalciferol (VITAMIN D) 50 MCG (2000 UT) tablet Take 2,000 Units by mouth daily.    ibuprofen (ADVIL) 200 MG tablet You can take 2 to 3 tablets every 6 hours as needed for pain. You can alternate this with plain Tylenol/acetaminophen. You can buy both of these medications over-the-counter at any drugstore.   lisinopril (ZESTRIL) 10 MG tablet Take 1 tablet (10 mg total) by mouth daily.   olopatadine (PATANOL) 0.1 % ophthalmic solution Place 1 drop into both eyes 2 (two) times daily as needed for allergies.   rosuvastatin (CRESTOR) 20 MG tablet Take 1 tablet (20 mg total) by mouth daily.   No facility-administered encounter medications on file as of 07/16/2023.    Allergies (verified) Other, Azithromycin, and Erythromycin   History: Past Medical History:  Diagnosis Date   Anomaly    heart structure - ischemia   Hyperlipidemia    Hypertension    Past Surgical History:  Procedure Laterality Date   CESAREAN SECTION  1995   CHOLECYSTECTOMY N/A 08/10/2019   Procedure: LAPAROSCOPIC CHOLECYSTECTOMY WITH  INTRAOPERATIVE CHOLANGIOGRAM;  Surgeon: Almond Lint, MD;  Location: [facility];  Service: General;  Laterality: N/A;   TONSILLECTOMY     Family History  Problem Relation Age of Onset   Heart disease Mother    Diabetes Mother    Kidney failure Mother    Heart failure Father  Hyperlipidemia Sister    Hypertension Sister    Hypertension Brother    Hyperlipidemia Brother    Heart attack Maternal Grandmother    Heart attack Maternal Grandfather    Stroke Maternal Grandfather    Heart failure Paternal Grandmother    Heart attack Paternal Grandfather    Hyperlipidemia Sister    Hypertension Sister    Hyperlipidemia Sister    Social History   Socioeconomic History   Marital status: Divorced    Spouse name: Not on file   Number of children: Not on file   Years of education: Not  on file   Highest education level: Doctorate  Occupational History   Not on file  Tobacco Use   Smoking status: Never   Smokeless tobacco: Never  Vaping Use   Vaping status: Never Used  Substance and Sexual Activity   Alcohol use: Yes    Comment: occassional   Drug use: Never   Sexual activity: Not on file  Other Topics Concern   Not on file  Social History Narrative   Not on file   Social Drivers of Health   Financial Resource Strain: Low Risk  (07/16/2023)   Overall Financial Resource Strain (CARDIA)    Difficulty of Paying Living Expenses: Not hard at all  Food Insecurity: No Food Insecurity (07/16/2023)   Hunger Vital Sign    Worried About Running Out of Food in the Last Year: Never true    Ran Out of Food in the Last Year: Never true  Transportation Needs: No Transportation Needs (07/16/2023)   PRAPARE - Administrator, Civil Service (Medical): No    Lack of Transportation (Non-Medical): No  Physical Activity: Sufficiently Active (07/16/2023)   Exercise Vital Sign    Days of Exercise per Week: 5 days    Minutes of Exercise per Session: 30 min  Stress: No Stress Concern Present (07/16/2023)   [employer] of Occupational Health - Occupational Stress Questionnaire    Feeling of Stress : Only a little  Social Connections: Moderately Integrated (07/16/2023)   Social Connection and Isolation Panel [NHANES]    Frequency of Communication with Friends and Family: More than three times a week    Frequency of Social Gatherings with Friends and Family: More than three times a week    Attends Religious Services: More than 4 times per year    Active Member of [employer] or Organizations: Yes    Attends Engineer, structural: More than 4 times per year    Marital Status: Divorced    Tobacco Counseling Counseling given: Not Answered   Clinical Intake:  Pre-visit preparation completed: Yes  Pain : No/denies pain     BMI - recorded: 27.26 Nutritional  Status: BMI 25 -29 Overweight Nutritional Risks: None Diabetes: No  How often do you need to have someone help you when you read instructions, pamphlets, or other written materials from your doctor or pharmacy?: 1 - Never  Interpreter Needed?: No  Information entered by :: Theresa Mulligan LPN   Activities of Daily Living    07/16/2023   10:14 AM 07/09/2023   12:49 PM  In your present state of health, do you have any difficulty performing the following activities:  Hearing? 0 0  Vision? 0 0  Difficulty concentrating or making decisions? 0 0  Walking or climbing stairs? 0 0  Dressing or bathing? 0 0  Doing errands, shopping? 0 0  Preparing Food and eating ? N N  Using the  Toilet? N N  In the past six months, have you accidently leaked urine? N N  Do you have problems with loss of bowel control? N N  Managing your Medications? N N  Managing your Finances? N N  Housekeeping or managing your Housekeeping? N N    Patient Care Team: Olive Bass, FNP as PCP - General (Internal Medicine)  Indicate any recent Medical Services you may have received from other than [facility] providers in the past year (date may be approximate).     Assessment:   This is a routine wellness examination for [facility].  Hearing/Vision screen Hearing Screening - Comments:: Denies hearing difficulties   Vision Screening - Comments:: Wears rx glasses - up to date with routine eye exams with  [facility]   Goals Addressed               This Visit's Progress     Increase physical activity (pt-stated)        Remain Active.       Depression Screen    07/16/2023   10:23 AM 04/09/2023   10:30 AM 02/15/2022    9:43 AM 08/01/2021    1:20 PM 02/07/2021    8:58 AM  PHQ 2/9 Scores  PHQ - 2 Score 0 0 0 0 0  PHQ- 9 Score   0      Fall Risk    07/16/2023   10:15 AM 07/09/2023   12:49 PM 04/09/2023   10:30 AM 02/15/2022    9:42 AM 08/01/2021    1:19 PM  Fall Risk   Falls in the past year?  0 0 0 0 0  Number falls in past yr: 0 0 0 0 0  Injury with Fall? 0 0 0 0 0  Risk for fall due to : No Fall Risks  No Fall Risks No Fall Risks No Fall Risks  Follow up Falls prevention discussed;Falls evaluation completed  Falls evaluation completed Falls evaluation completed Falls evaluation completed    MEDICARE RISK AT HOME: Medicare Risk at Home Any stairs in or around the home?: No If so, are there any without handrails?: No Home free of loose throw rugs in walkways, pet beds, electrical cords, etc?: Yes Adequate lighting in your home to reduce risk of falls?: Yes Life alert?: No Use of a cane, walker or w/c?: No Grab bars in the bathroom?: No Shower chair or bench in shower?: Yes Elevated toilet seat or a handicapped toilet?: Yes  TIMED UP AND GO:  Was the test performed?  No    Cognitive Function:        07/16/2023   10:15 AM  6CIT Screen  What Year? 0 points  What month? 0 points  What time? 0 points  Count back from 20 0 points  Months in reverse 0 points  Repeat phrase 0 points  Total Score 0 points    Immunizations Immunization History  Administered Date(s) Administered   Td 09/30/2002    TDAP status: Due, Education has been provided regarding the importance of this vaccine. Advised may receive this vaccine at local pharmacy or Health Dept. Aware to provide a copy of the vaccination record if obtained from local pharmacy or Health Dept. Verbalized acceptance and understanding.  Flu Vaccine status: Due, Education has been provided regarding the importance of this vaccine. Advised may receive this vaccine at local pharmacy or Health Dept. Aware to provide a copy of the vaccination record if obtained from local pharmacy  or Health Dept. Verbalized acceptance and understanding.        Screening Tests Health Maintenance  Topic Date Due   INFLUENZA VACCINE  08/05/2023 (Originally 12/06/2022)   DTaP/Tdap/Td (2 - Tdap) 04/08/2024 (Originally 09/29/2012)    Fecal DNA (Cologuard)  04/13/2024   Medicare Annual Wellness (AWV)  07/15/2024   MAMMOGRAM  04/16/2025   DEXA SCAN  Completed   Hepatitis C Screening  Completed   HPV VACCINES  Aged Out   Pneumonia Vaccine 63+ Years old  Discontinued   COVID-19 Vaccine  Discontinued   Zoster Vaccines- Shingrix  Discontinued    Health Maintenance  There are no preventive care reminders to display for this patient.   Colorectal cancer screening: Type of screening: Cologuard. Completed 04/13/21. Repeat every 3 years  Mammogram status: Completed 04/17/23. Repeat every year  Bone Density status: Completed 04/17/23. Results reflect: Bone density results: OSTEOPENIA. Repeat every   years.    Additional Screening:  Hepatitis C Screening: does qualify; Completed 08/09/19  Vision Screening: Recommended annual ophthalmology exams for early detection of glaucoma and other disorders of the eye. Is the patient up to date with their annual eye exam?  Yes  Who is the provider or what is the name of the office in which the patient attends annual eye exams? [facility] If pt is not established with a provider, would they like to be referred to a provider to establish care? No .   Dental Screening: Recommended annual dental exams for proper oral hygiene    Community Resource Referral / Chronic Care Management:  CRR required this visit?  No   CCM required this visit?  No     Plan:     I have personally reviewed and noted the following in the patient's chart:   Medical and social history Use of alcohol, tobacco or illicit drugs  Current medications and supplements including opioid prescriptions. Patient is not currently taking opioid prescriptions. Functional ability and status Nutritional status Physical activity Advanced directives List of other physicians Hospitalizations, surgeries, and ER visits in previous 12 months Vitals Screenings to include cognitive, depression, and  falls Referrals and appointments  In addition, I have reviewed and discussed with patient certain preventive protocols, quality metrics, and best practice recommendations. A written personalized care plan for preventive services as well as general preventive health recommendations were provided to patient.     Tillie Rung, LPN   [id number]   After Visit Summary: (MyChart) Due to this being a telephonic visit, the after visit summary with patients personalized plan was offered to patient via MyChart   Nurse Notes: None

## 2023-08-08 ENCOUNTER — Encounter: Payer: Self-pay | Admitting: Family

## 2024-04-14 ENCOUNTER — Encounter: Payer: Medicare Other | Admitting: Family

## 2024-04-14 ENCOUNTER — Other Ambulatory Visit: Payer: Self-pay

## 2024-04-14 MED ORDER — LISINOPRIL 10 MG PO TABS: 10.0000 mg | ORAL_TABLET | Freq: Every day | ORAL | 3 refills | Status: AC

## 2024-04-15 ENCOUNTER — Other Ambulatory Visit: Payer: Self-pay

## 2024-05-13 ENCOUNTER — Other Ambulatory Visit: Payer: Self-pay | Admitting: Medical Genetics

## 2024-05-19 ENCOUNTER — Encounter: Payer: Self-pay | Admitting: Family Medicine

## 2024-05-19 ENCOUNTER — Ambulatory Visit (INDEPENDENT_AMBULATORY_CARE_PROVIDER_SITE_OTHER): Admitting: Family Medicine

## 2024-05-19 VITALS — BP 118/80 | HR 78 | Temp 97.7°F | Resp 18 | Ht 70.0 in

## 2024-05-19 DIAGNOSIS — E7801 Homozygous familial hypercholesterolemia (hofh): Secondary | ICD-10-CM | POA: Diagnosis not present

## 2024-05-19 DIAGNOSIS — E785 Hyperlipidemia, unspecified: Secondary | ICD-10-CM | POA: Diagnosis not present

## 2024-05-19 DIAGNOSIS — I10 Essential (primary) hypertension: Secondary | ICD-10-CM

## 2024-05-19 DIAGNOSIS — Z1211 Encounter for screening for malignant neoplasm of colon: Secondary | ICD-10-CM | POA: Diagnosis not present

## 2024-05-19 DIAGNOSIS — E559 Vitamin D deficiency, unspecified: Secondary | ICD-10-CM

## 2024-05-19 LAB — LIPID PANEL
Cholesterol: 179 mg/dL (ref 28–200)
HDL: 55.6 mg/dL
LDL Cholesterol: 87 mg/dL (ref 10–99)
NonHDL: 123.58
Total CHOL/HDL Ratio: 3
Triglycerides: 181 mg/dL — ABNORMAL HIGH (ref 10.0–149.0)
VLDL: 36.2 mg/dL (ref 0.0–40.0)

## 2024-05-19 LAB — CBC WITH DIFFERENTIAL/PLATELET
Basophils Absolute: 0 K/uL (ref 0.0–0.1)
Basophils Relative: 0.7 % (ref 0.0–3.0)
Eosinophils Absolute: 0.1 K/uL (ref 0.0–0.7)
Eosinophils Relative: 2.3 % (ref 0.0–5.0)
HCT: 41.2 % (ref 36.0–46.0)
Hemoglobin: 13.9 g/dL (ref 12.0–15.0)
Lymphocytes Relative: 29.8 % (ref 12.0–46.0)
Lymphs Abs: 1.8 K/uL (ref 0.7–4.0)
MCHC: 33.7 g/dL (ref 30.0–36.0)
MCV: 85 fl (ref 78.0–100.0)
Monocytes Absolute: 0.3 K/uL (ref 0.1–1.0)
Monocytes Relative: 5.8 % (ref 3.0–12.0)
Neutro Abs: 3.6 K/uL (ref 1.4–7.7)
Neutrophils Relative %: 61.4 % (ref 43.0–77.0)
Platelets: 245 K/uL (ref 150.0–400.0)
RBC: 4.85 Mil/uL (ref 3.87–5.11)
RDW: 13.3 % (ref 11.5–15.5)
WBC: 5.9 K/uL (ref 4.0–10.5)

## 2024-05-19 LAB — COMPREHENSIVE METABOLIC PANEL WITH GFR
ALT: 13 U/L (ref 3–35)
AST: 14 U/L (ref 5–37)
Albumin: 4.4 g/dL (ref 3.5–5.2)
Alkaline Phosphatase: 65 U/L (ref 39–117)
BUN: 11 mg/dL (ref 6–23)
CO2: 25 meq/L (ref 19–32)
Calcium: 9.3 mg/dL (ref 8.4–10.5)
Chloride: 105 meq/L (ref 96–112)
Creatinine, Ser: 0.82 mg/dL (ref 0.40–1.20)
GFR: 73.61 mL/min
Glucose, Bld: 83 mg/dL (ref 70–99)
Potassium: 4.4 meq/L (ref 3.5–5.1)
Sodium: 138 meq/L (ref 135–145)
Total Bilirubin: 0.4 mg/dL (ref 0.2–1.2)
Total Protein: 6.9 g/dL (ref 6.0–8.3)

## 2024-05-19 LAB — TSH: TSH: 3.4 u[IU]/mL (ref 0.35–5.50)

## 2024-05-19 LAB — VITAMIN D 25 HYDROXY (VIT D DEFICIENCY, FRACTURES): VITD: 28.95 ng/mL — ABNORMAL LOW (ref 30.00–100.00)

## 2024-05-19 NOTE — Progress Notes (Signed)
 "  Subjective:    Patient ID: Sheila Atkinson, female    DOB: [birth year], 69 y.o.   MRN: [id number]  Chief Complaint  Patient presents with   Transfer of Care    HPI Patient is in today for annual f/u.  Discussed the use of AI scribe software for clinical note transcription with the patient, who gave verbal consent to proceed.  History of Present Illness Sheila Atkinson is a 69 year old female who presents for an annual check-up and medication refills.  She is focused on improving her lifestyle, including diet, sleep, exercise, and Bible study routines. Her sleep schedule is consistent, going to bed at 10:15 PM and waking up at 8:00 AM. She has adjusted her diet to manage stomach acid issues by having her main meal at lunch and a light dinner of fruit or yogurt. She has given up coffee due to stomach acid issues and manages occasional acid reflux with Tums. She ensures adequate protein intake with meals like an egg and multigrain toast for breakfast and high-protein yogurt for dinner.  She is currently taking rosuvastatin , lisinopril , vitamin D3, and an 81 mg aspirin . She has not eaten today in preparation for her blood draw. She does not receive flu shots or other vaccines, citing her overall good health and a history of being sick only once in her adult life.  Her family history is significant for her sister being diagnosed with glioblastoma on August 8th, which has affected her sister's short-term memory and ability to work due to seizure risk. Her sister's daughter has returned from [place]  to care for her.  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 She is covered under Medicare and Tricare and receives VA disability for her back, carpal tunnel, and neck issues. 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    Past Medical History:  Diagnosis Date   Anomaly     heart structure - ischemia   Hyperlipidemia    Hypertension     Past Surgical History:  Procedure Laterality Date   CESAREAN SECTION  1995   CHOLECYSTECTOMY N/A 08/10/2019   Procedure: LAPAROSCOPIC CHOLECYSTECTOMY WITH  INTRAOPERATIVE CHOLANGIOGRAM;  Surgeon: Aron Shoulders, MD;  Location: [facility];  Service: General;  Laterality: N/A;   TONSILLECTOMY      Family History  Problem Relation Age of Onset   Heart disease Mother    Diabetes Mother    Kidney failure Mother    Heart failure Father    Hyperlipidemia Sister    Hypertension Sister    Cancer Sister        glioblastoma   Hyperlipidemia Sister    Hypertension Sister    Hyperlipidemia Sister    Hypertension Brother    Hyperlipidemia Brother    Heart attack Maternal Grandmother    Heart attack Maternal Grandfather    Stroke Maternal Grandfather    Heart failure Paternal Grandmother    Heart attack Paternal Grandfather     Social History   Socioeconomic History   Marital status: Divorced    Spouse name: Not on file   Number of children: Not on file   Years of education: Not on file   Highest education level: Doctorate  Occupational History   Not on file  Tobacco Use   Smoking status: Never   Smokeless tobacco: Never  Vaping Use   Vaping status: Never  Used  Substance and Sexual Activity   Alcohol use: Yes    Comment: occassional   Drug use: Never   Sexual activity: Not on file  Other Topics Concern   Not on file  Social History Narrative   Not on file   Social Drivers of Health   Tobacco Use: Low Risk (05/19/2024)   Patient History    Smoking Tobacco Use: Never    Smokeless Tobacco Use: Never    Passive Exposure: Not on file  Financial Resource Strain: Low Risk (05/12/2024)   Overall Financial Resource Strain (CARDIA)    Difficulty of Paying Living Expenses: Not hard at all  Food Insecurity: No Food Insecurity (05/12/2024)   Epic    Worried About Radiation Protection Practitioner of Food in the Last Year: Never true    Ran  Out of Food in the Last Year: Never true  Transportation Needs: No Transportation Needs (05/12/2024)   Epic    Lack of Transportation (Medical): No    Lack of Transportation (Non-Medical): No  Physical Activity: Sufficiently Active (05/12/2024)   Exercise Vital Sign    Days of Exercise per Week: 5 days    Minutes of Exercise per Session: 30 min  Stress: No Stress Concern Present (05/12/2024)   [employer] - Occupational Stress Questionnaire    Feeling of Stress: Only a little  Social Connections: Moderately Integrated (05/12/2024)   Social Connection and Isolation Panel    Frequency of Communication with Friends and Family: More than three times a week    Frequency of Social Gatherings with Friends and Family: More than three times a week    Attends Religious Services: More than 4 times per year    Active Member of [employer] or Organizations: Yes    Attends Banker Meetings: More than 4 times per year    Marital Status: Divorced  Intimate Partner Violence: Not At Risk (07/16/2023)   Humiliation, Afraid, Rape, and Kick questionnaire    Fear of Current or Ex-Partner: No    Emotionally Abused: No    Physically Abused: No    Sexually Abused: No  Depression (PHQ2-9): Low Risk (07/16/2023)   Depression (PHQ2-9)    PHQ-2 Score: 0  Alcohol Screen: Low Risk (05/12/2024)   Alcohol Screen    Last Alcohol Screening Score (AUDIT): 1  Housing: Low Risk (05/12/2024)   Epic    Unable to Pay for Housing in the Last Year: No    Number of Times Moved in the Last Year: 0    Homeless in the Last Year: No  Utilities: Not At Risk (07/16/2023)   [facility] Utilities    Threatened with loss of utilities: No  Health Literacy: Adequate Health Literacy (07/16/2023)   B1300 Health Literacy    Frequency of need for help with medical instructions: Never    Outpatient Medications Prior to Visit  Medication Sig Dispense Refill   aspirin  EC 81 MG tablet Take 1 tablet by mouth daily.      Cholecalciferol  (VITAMIN D ) 50 MCG (2000 UT) tablet Take 2,000 Units by mouth daily.      ibuprofen  (ADVIL ) 200 MG tablet You can take 2 to 3 tablets every 6 hours as needed for pain. You can alternate this with plain Tylenol /acetaminophen . You can buy both of these medications over-the-counter at any drugstore.     lisinopril  (ZESTRIL ) 10 MG tablet Take 1 tablet (10 mg total) by mouth daily. 90 tablet 3   olopatadine (PATANOL) 0.1 % ophthalmic  solution Place 1 drop into both eyes 2 (two) times daily as needed for allergies.     rosuvastatin  (CRESTOR ) 20 MG tablet Take 1 tablet (20 mg total) by mouth daily. 90 tablet 3   No facility-administered medications prior to visit.    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  HENT:  Negative for congestion and hearing loss.   Eyes:  Negative for blurred vision and discharge.  Respiratory:  Negative for cough, sputum production and shortness of breath.   Cardiovascular:  Negative for chest pain, palpitations and leg swelling.  Gastrointestinal:  Negative for abdominal pain, blood in stool, constipation, diarrhea, heartburn, nausea and vomiting.  Genitourinary:  Negative for dysuria, frequency, hematuria and urgency.  Musculoskeletal:  Negative for back pain, falls and myalgias.  Skin:  Negative for rash.  Neurological:  Negative for dizziness, sensory change, loss of consciousness, weakness and headaches.  Endo/Heme/Allergies:  Negative for environmental allergies. Does not bruise/bleed easily.  Psychiatric/Behavioral:  Negative for depression and suicidal ideas. The patient is not nervous/anxious and does not have insomnia.        Objective:    Physical Exam Vitals and nursing note reviewed.  Constitutional:      General: She is not  in acute distress.    Appearance: Normal appearance. She is well-developed.  HENT:     Head: Normocephalic and atraumatic.     Right Ear: Tympanic membrane, ear canal and external ear normal. There is no impacted cerumen.     Left Ear: Tympanic membrane, ear canal and external ear normal. There is no impacted cerumen.     Nose: Nose normal.     Mouth/Throat:     Mouth: Mucous membranes are moist.     Pharynx: Oropharynx is clear. No oropharyngeal exudate or posterior oropharyngeal erythema.  Eyes:     General: No scleral icterus.       Right eye: No discharge.        Left eye: No discharge.     Conjunctiva/sclera: Conjunctivae normal.     Pupils: Pupils are equal, round, and reactive to light.  Neck:     Thyroid : No thyromegaly or thyroid  tenderness.     Vascular: No JVD.  Cardiovascular:     Rate and Rhythm: Normal rate and regular rhythm.     Heart sounds: Normal heart sounds. No murmur heard. Pulmonary:     Effort: Pulmonary effort is normal. No respiratory distress.     Breath sounds: Normal breath sounds.  Abdominal:     General: Bowel sounds are normal. There is no distension.     Palpations: Abdomen is soft. There is no mass.     Tenderness: There is no abdominal tenderness. There is no guarding or rebound.  Genitourinary:    Vagina: Normal.  Musculoskeletal:        General: Normal range of motion.     Cervical back: Normal range of motion and neck supple.     Right lower leg: No edema.     Left lower leg: No edema.  Lymphadenopathy:     Cervical: No cervical adenopathy.  Skin:    General: Skin is warm and dry.     Findings: No erythema or rash.  Neurological:  Mental Status: She is alert and oriented to person, place, and time.     Cranial Nerves: No cranial nerve deficit.     Deep Tendon Reflexes: Reflexes are normal and symmetric.  Psychiatric:        Mood and Affect: Mood normal.        Behavior: Behavior normal.        Thought Content: Thought content  normal.        Judgment: Judgment normal.     BP 118/80 (BP Location: Left Arm, Patient Position: Sitting, Cuff Size: Large)   Pulse 78   Temp 97.7 F (36.5 C) (Oral)   Resp 18   Ht 5' 10 (1.778 m)   SpO2 98%   BMI 27.26 kg/m  Wt Readings from Last 3 Encounters:  07/16/23 190 lb (86.2 kg)  04/09/23 190 lb (86.2 kg)  02/15/22 190 lb (86.2 kg)    Diabetic Foot Exam - Simple   No data filed    Lab Results  Component Value Date   WBC 5.6 04/09/2023   HGB 14.7 04/09/2023   HCT 44.7 04/09/2023   PLT 246.0 04/09/2023   GLUCOSE 93 04/09/2023   CHOL 198 04/09/2023   TRIG 225.0 (H) 04/09/2023   HDL 55.50 04/09/2023   LDLCALC 98 04/09/2023   ALT 25 04/09/2023   AST 20 04/09/2023   NA 137 04/09/2023   K 4.6 04/09/2023   CL 102 04/09/2023   CREATININE 0.82 04/09/2023   BUN 11 04/09/2023   CO2 30 04/09/2023   TSH 3.62 02/08/2021    Lab Results  Component Value Date   TSH 3.62 02/08/2021   Lab Results  Component Value Date   WBC 5.6 04/09/2023   HGB 14.7 04/09/2023   HCT 44.7 04/09/2023   MCV 88.5 04/09/2023   PLT 246.0 04/09/2023   Lab Results  Component Value Date   NA 137 04/09/2023   K 4.6 04/09/2023   CO2 30 04/09/2023   GLUCOSE 93 04/09/2023   BUN 11 04/09/2023   CREATININE 0.82 04/09/2023   BILITOT 0.5 04/09/2023   ALKPHOS 58 04/09/2023   AST 20 04/09/2023   ALT 25 04/09/2023   PROT 7.3 04/09/2023   ALBUMIN 4.6 04/09/2023   CALCIUM  9.5 04/09/2023   ANIONGAP 7 08/11/2019   GFR 74.19 04/09/2023   Lab Results  Component Value Date   CHOL 198 04/09/2023   Lab Results  Component Value Date   HDL 55.50 04/09/2023   Lab Results  Component Value Date   LDLCALC 98 04/09/2023   Lab Results  Component Value Date   TRIG 225.0 (H) 04/09/2023   Lab Results  Component Value Date   CHOLHDL 4 04/09/2023   No results found for: HGBA1C     Assessment & Plan:  Primary hypertension -     CBC with Differential/Platelet -     Comprehensive  metabolic panel with GFR -     TSH  Hyperlipidemia, unspecified hyperlipidemia type -     CBC with Differential/Platelet -     Comprehensive metabolic panel with GFR -     Lipid panel -     TSH  Colon cancer screening -     Cologuard  Vitamin D  deficiency -     VITAMIN D  25 Hydroxy (Vit-D Deficiency, Fractures)  Essential hypertension Assessment & Plan: Well controlled, no changes to meds. Encouraged heart healthy diet such as the DASH diet and exercise as tolerated.     Homozygous familial hypercholesterolemia Assessment &  Plan: Encourage heart healthy diet such as MIND or DASH diet, increase exercise, avoid trans fats, simple carbohydrates and processed foods, consider a krill or fish or flaxseed oil cap daily.     Assessment and Plan Assessment & Plan Adult Wellness Visit   Routine annual wellness visit with no acute concerns. Family history updated with sister's glioblastoma diagnosis. Skin examination shows no moles or concerns, and she continues regular dermatology visits with Dr. Kendell. No new vaccinations planned as she declines flu shots and tetanus vaccine. No recent illnesses reported. She maintains a regular exercise routine despite the loss of the Silver Sneakers program. Diet and sleep habits have been reviewed and adjusted with no significant changes in health status. Scheduled mammogram and ordered Cologuard test. Continue regular dermatology visits, exercise routine, and maintain current diet and sleep habits.  Primary hypertension   Hypertension is well-controlled with current medication regimen. Continue lisinopril .  Hyperlipidemia   Managed with rosuvastatin . Continue rosuvastatin .  Vitamin D  deficiency   Continue vitamin D3 supplementation.    Johni Narine R Lowne Chase, DO  "

## 2024-05-19 NOTE — Assessment & Plan Note (Signed)
 Well controlled, no changes to meds. Encouraged heart healthy diet such as the DASH diet and exercise as tolerated.

## 2024-05-19 NOTE — Assessment & Plan Note (Signed)
 Encourage heart healthy diet such as MIND or DASH diet, increase exercise, avoid trans fats, simple carbohydrates and processed foods, consider a krill or fish or flaxseed oil cap daily.

## 2024-05-24 ENCOUNTER — Ambulatory Visit: Payer: Self-pay | Admitting: Family Medicine

## 2024-05-25 ENCOUNTER — Other Ambulatory Visit: Payer: Self-pay

## 2024-05-25 MED ORDER — VITAMIN D (ERGOCALCIFEROL) 1.25 MG (50000 UNIT) PO CAPS: 50000.0000 [IU] | ORAL_CAPSULE | ORAL | 1 refills | Status: AC

## 2024-05-28 ENCOUNTER — Other Ambulatory Visit

## 2024-05-28 DIAGNOSIS — Z006 Encounter for examination for normal comparison and control in clinical research program: Secondary | ICD-10-CM

## 2024-06-02 LAB — COLOGUARD: COLOGUARD: NEGATIVE

## 2024-06-05 LAB — GENECONNECT MOLECULAR SCREEN: Genetic Analysis Overall Interpretation: NEGATIVE

## 2024-07-21 ENCOUNTER — Ambulatory Visit

## 2025-05-25 ENCOUNTER — Encounter: Admitting: Family Medicine
